# Patient Record
Sex: Female | Born: 2000 | Race: Black or African American | Hispanic: No | Marital: Single | Smoking: Never smoker
Health system: Southern US, Community
[De-identification: ages and names within clinical notes are randomized; demographics above are authoritative.]

## PROBLEM LIST (undated history)

## (undated) DIAGNOSIS — F32A Depression, unspecified: Secondary | ICD-10-CM

## (undated) DIAGNOSIS — T1491XA Suicide attempt, initial encounter: Secondary | ICD-10-CM

## (undated) DIAGNOSIS — F419 Anxiety disorder, unspecified: Secondary | ICD-10-CM

---
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
## 2020-06-24 ENCOUNTER — Emergency Department (HOSPITAL_COMMUNITY)
Admission: EM | Admit: 2020-06-24 | Discharge: 2020-06-25 | Disposition: A | Discharge status: Other Acute Inpatient Hospital | Payer: Self-pay | Attending: Emergency Medicine | Admitting: Emergency Medicine

## 2020-06-24 ENCOUNTER — Encounter (HOSPITAL_COMMUNITY): Payer: Self-pay | Admitting: *Deleted

## 2020-06-24 ENCOUNTER — Other Ambulatory Visit: Payer: Self-pay

## 2020-06-24 DIAGNOSIS — T39392A Poisoning by other nonsteroidal anti-inflammatory drugs [NSAID], intentional self-harm, initial encounter: Secondary | ICD-10-CM | POA: Insufficient documentation

## 2020-06-24 DIAGNOSIS — Z20822 Contact with and (suspected) exposure to covid-19: Secondary | ICD-10-CM | POA: Insufficient documentation

## 2020-06-24 DIAGNOSIS — T50902A Poisoning by unspecified drugs, medicaments and biological substances, intentional self-harm, initial encounter: Secondary | ICD-10-CM

## 2020-06-24 DIAGNOSIS — F333 Major depressive disorder, recurrent, severe with psychotic symptoms: Secondary | ICD-10-CM | POA: Insufficient documentation

## 2020-06-24 HISTORY — DX: Depression, unspecified: F32.A

## 2020-06-24 HISTORY — DX: Anxiety disorder, unspecified: F41.9

## 2020-06-24 HISTORY — DX: Suicide attempt, initial encounter: T14.91XA

## 2020-06-24 LAB — COMPREHENSIVE METABOLIC PANEL
ALT: 20 U/L (ref 0–44)
AST: 23 U/L (ref 15–41)
Albumin: 4 g/dL (ref 3.5–5.0)
Alkaline Phosphatase: 49 U/L (ref 38–126)
Anion gap: 11 (ref 5–15)
BUN: 13 mg/dL (ref 6–20)
CO2: 22 mmol/L (ref 22–32)
Calcium: 9.6 mg/dL (ref 8.9–10.3)
Chloride: 106 mmol/L (ref 98–111)
Creatinine, Ser: 0.92 mg/dL (ref 0.44–1.00)
GFR, Estimated: >60 mL/min (ref >60)
Glucose, Bld: 94 mg/dL (ref 70–99)
Potassium: 4 mmol/L (ref 3.5–5.1)
Sodium: 139 mmol/L (ref 135–145)
Total Bilirubin: 1.2 mg/dL (ref 0.3–1.2)
Total Protein: 7 g/dL (ref 6.5–8.1)

## 2020-06-24 LAB — CBC
HCT: 41.1 % (ref 36.0–46.0)
Hemoglobin: 13.1 g/dL (ref 12.0–15.0)
MCH: 28.4 pg (ref 26.0–34.0)
MCHC: 31.9 g/dL (ref 30.0–36.0)
MCV: 89.2 fL (ref 80.0–100.0)
Platelets: 319 10*3/uL (ref 150–400)
RBC: 4.61 MIL/uL (ref 3.87–5.11)
RDW: 12.3 % (ref 11.5–15.5)
WBC: 5.8 10*3/uL (ref 4.0–10.5)
nRBC: 0 % (ref 0.0–0.2)

## 2020-06-24 LAB — ETHANOL: Alcohol, Ethyl (B): <10 mg/dL (ref <10)

## 2020-06-24 LAB — RAPID URINE DRUG SCREEN, HOSP PERFORMED
Amphetamines: NOT DETECTED
Barbiturates: NOT DETECTED
Benzodiazepines: NOT DETECTED
Cocaine: NOT DETECTED
Opiates: NOT DETECTED
Tetrahydrocannabinol: NOT DETECTED

## 2020-06-24 LAB — I-STAT BETA HCG BLOOD, ED (MC, WL, AP ONLY): I-stat hCG, quantitative: <5 m[IU]/mL (ref <5)

## 2020-06-24 LAB — SALICYLATE LEVEL: Salicylate Lvl: <7 mg/dL — ABNORMAL LOW (ref 7.0–30.0)

## 2020-06-24 LAB — ACETAMINOPHEN LEVEL: Acetaminophen (Tylenol), Serum: <10 ug/mL — ABNORMAL LOW (ref 10–30)

## 2020-06-24 MED ORDER — NORGESTIMATE-ETH ESTRADIOL 0.25-35 MG-MCG PO TABS: 1.0000 | ORAL_TABLET | Freq: Every day | ORAL | Status: DC

## 2020-06-24 NOTE — ED Provider Notes (Signed)
Alexandria Stokes Provider Note   CSN: [id number] Arrival date & time: 06/24/20  1622     History Chief Complaint  Patient presents with  . Ingestion    Alexandria Stokes is a 19 y.o. female.  HPI  Patient presents after intentional ingestion of naprosyn tablets (~15-20) in a suicide attempt.  She notes waxing / waning suicidal ideation for some time, but today she decided to act upon her thoughts.  She has no prior hospitalizations.  She did have n/v following the ingestion, and it is unclear how much of the ingestion was vomited back up. She currently denies physical pain / dyspnea, n/v, or any complaints.  When asked about her current SI, she notes that it has diminished, but remains present.   Past Medical History:  Diagnosis Date  . Anxiety   . Depression   . Suicide attempt (HCC)     There are no problems to display for this patient.   History reviewed. No pertinent surgical history.   OB History   No obstetric history on file.     No family history on file.  Social History   Tobacco Use  . Smoking status: Never Smoker  . Smokeless tobacco: Never Used  Substance Use Topics  . Alcohol use: No  . Drug use: No    Home Medications Prior to Admission medications   Medication Sig Start Date End Date Taking? Authorizing Provider  naproxen (NAPROSYN) 250 MG tablet Take 250 mg by mouth once.   Yes [provider]  SPRINTEC 28 0.25-35 MG-MCG tablet Take 1 tablet by mouth daily. 05/21/20  Yes [provider]    Allergies    Patient has no known allergies.  Review of Systems   Review of Systems  Constitutional:       Per HPI, otherwise negative  HENT:       Per HPI, otherwise negative  Respiratory:       Per HPI, otherwise negative  Cardiovascular:       Per HPI, otherwise negative  Gastrointestinal: Negative for vomiting.  Endocrine:       Negative aside from HPI  Genitourinary:       Neg aside  from HPI   Musculoskeletal:       Per HPI, otherwise negative  Skin: Negative.   Neurological: Negative for syncope.  Psychiatric/Behavioral: Positive for dysphoric mood and suicidal ideas.    Physical Exam Updated Vital Signs BP 115/79 (BP Location: Right Arm)   Pulse (!) 102   Temp 98.5 F (36.9 C) (Oral)   Resp 16   Wt 74.8 kg   SpO2 100%   Physical Exam Vitals and nursing note reviewed.  Constitutional:      General: She is not in acute distress.    Appearance: She is well-developed.  HENT:     Head: Normocephalic and atraumatic.  Eyes:     Conjunctiva/sclera: Conjunctivae normal.  Pulmonary:     Effort: Pulmonary effort is normal. No respiratory distress.     Breath sounds: No stridor.  Abdominal:     General: There is no distension.  Skin:    General: Skin is warm and dry.  Neurological:     Mental Status: She is alert and oriented to person, place, and time.     Cranial Nerves: No cranial nerve deficit.  Psychiatric:        Behavior: Behavior is withdrawn.        Thought Content: Thought content  includes suicidal ideation.        Cognition and Memory: Cognition is not impaired. Memory is not impaired.      ED Results / Procedures / Treatments   Labs (all labs ordered are listed, but only abnormal results are displayed) Labs Reviewed  SALICYLATE LEVEL - Abnormal; Notable for the following components:      Result Value   Salicylate Lvl <7.0 (*)    All other components within normal limits  ACETAMINOPHEN LEVEL - Abnormal; Notable for the following components:   Acetaminophen (Tylenol), Serum <10 (*)    All other components within normal limits  COMPREHENSIVE METABOLIC PANEL  ETHANOL  CBC  RAPID URINE DRUG SCREEN, HOSP PERFORMED  ACETAMINOPHEN LEVEL  I-STAT BETA HCG BLOOD, ED ([facility], [facility], [facility] ONLY)    EKG None  Radiology No results found.  Procedures Procedures (including critical care time)  Medications Ordered in ED Medications - No data to  display  ED Course  I have reviewed the triage vital signs and the nursing notes.  Pertinent labs & imaging results that were available during my care of the patient were reviewed by me and considered in my medical decision making (see chart for details).  Update: Poison control recommends repeat acetaminophen level in 4 hours, continuous monitoring for reassurance that low level of ingestion has not produced toxic effects.  On monitor the patient is sinus rhythm, rate 80s, unremarkable.  Pulse oximetry 100% room air normal   10:23 PM Poison control has been contacted.  Without abnormalities, with reassuring EKG, negative talk screen, case has been closed.  Young adult female presents after intentional ingestion of multiple tablets of Naprosyn.  Patient is awake, alert, has no substantial electrolyte abnormalities, hemodynamic instability, has a reassuring EKG, required hours of monitoring in the ED without substantial decompensation.  Patient appropriate for further evaluation by our behavioral health colleagues.  Patient is not under involuntary commitment, has been cooperative.  She may be appropriate for contracting for safety should she elect to pursue additional psych care as an outpatient. Final Clinical Impression(s) / ED Diagnoses Final diagnoses:  Intentional drug overdose, initial encounter (HCC)   MDM Number of Diagnoses or Management Options Intentional drug overdose, initial encounter [facility]): new, needed workup   Amount and/or Complexity of Data Reviewed Clinical lab tests: reviewed Tests in the medicine section of CPT: reviewed Decide to obtain previous medical records or to obtain history from someone other than the patient: yes Obtain history from someone other than the patient: yes Review and summarize past medical records: yes Discuss the patient with other providers: yes  Risk of Complications, Morbidity, and/or Mortality Presenting problems: high Diagnostic  procedures: high Management options: high  Critical Care Total time providing critical care: < 30 minutes  Patient Progress Patient progress: stable    Gerhard Munch, MD 06/24/20 2226

## 2020-06-24 NOTE — ED Triage Notes (Signed)
Pt hands me a bottle of naproxen and states that she took a handful".  Pt states that she put them all in her mouth and swallowed 15-20 pills and spit the rest out.  Pt did this in suicidal ideation. Pt states that it is a mental thing and that something similar occurred about a month ago.  Pt is calm and cooperative at this time.

## 2020-06-24 NOTE — ED Notes (Signed)
Call to poison control at this time.   Advised them that pt reports that she took 15-20 naproxen 220mg  tablets at 3pm.  Poison control would like EKG and check a 4 hour tylenol level at 7pm since her ingestion was at 3pm.

## 2020-06-24 NOTE — ED Notes (Signed)
Spoke with poison control; says they will close case due to no concerning findings, subject to receiving call from hospital of additional concerns.
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
## 2020-06-25 ENCOUNTER — Encounter (HOSPITAL_COMMUNITY): Payer: Self-pay | Admitting: Nurse Practitioner

## 2020-06-25 ENCOUNTER — Inpatient Hospital Stay (HOSPITAL_COMMUNITY)
Admission: AD | Admit: 2020-06-25 | Discharge: 2020-06-28 | DRG: 885 | Disposition: A | Discharge status: Home / Self Care | Payer: Federal, State, Local not specified - Other | Attending: Psychiatry | Admitting: Psychiatry

## 2020-06-25 DIAGNOSIS — Z23 Encounter for immunization: Secondary | ICD-10-CM

## 2020-06-25 DIAGNOSIS — F332 Major depressive disorder, recurrent severe without psychotic features: Secondary | ICD-10-CM | POA: Diagnosis present

## 2020-06-25 DIAGNOSIS — F411 Generalized anxiety disorder: Secondary | ICD-10-CM

## 2020-06-25 DIAGNOSIS — Z818 Family history of other mental and behavioral disorders: Secondary | ICD-10-CM | POA: Diagnosis not present

## 2020-06-25 DIAGNOSIS — T39312D Poisoning by propionic acid derivatives, intentional self-harm, subsequent encounter: Secondary | ICD-10-CM

## 2020-06-25 DIAGNOSIS — F4310 Post-traumatic stress disorder, unspecified: Secondary | ICD-10-CM

## 2020-06-25 LAB — RESPIRATORY PANEL BY RT PCR (FLU A&B, COVID)
Influenza A by PCR: NEGATIVE
Influenza B by PCR: NEGATIVE
SARS Coronavirus 2 by RT PCR: NEGATIVE

## 2020-06-25 MED ORDER — ALUM & MAG HYDROXIDE-SIMETH 200-200-20 MG/5ML PO SUSP: 30.0000 mL | ORAL | Status: DC | PRN

## 2020-06-25 MED ORDER — TRETINOIN 0.025 % EX CREA: TOPICAL_CREAM | Freq: Every day | CUTANEOUS | Status: DC

## 2020-06-25 MED ORDER — HYDROXYZINE HCL 25 MG PO TABS
25.0000 mg | ORAL_TABLET | Freq: Three times a day (TID) | ORAL | Status: DC | PRN
  Filled 2020-06-25 (×2): qty 1
  Filled 2020-06-25: qty 10

## 2020-06-25 MED ORDER — NORGESTIMATE-ETH ESTRADIOL 0.25-35 MG-MCG PO TABS
ORAL_TABLET | Freq: Every day | ORAL | Status: DC
  Administered 2020-06-25 – 2020-06-28 (×4): 1 via ORAL

## 2020-06-25 MED ORDER — INFLUENZA VAC SPLIT QUAD 0.5 ML IM SUSY
0.5000 mL | PREFILLED_SYRINGE | INTRAMUSCULAR | Status: AC
  Administered 2020-06-27: 0.5 mL via INTRAMUSCULAR
  Filled 2020-06-25: qty 0.5

## 2020-06-25 MED ORDER — SERTRALINE HCL 25 MG PO TABS
25.0000 mg | ORAL_TABLET | Freq: Every day | ORAL | Status: DC
  Administered 2020-06-25 – 2020-06-26 (×2): 25 mg via ORAL
  Filled 2020-06-25 (×4): qty 1

## 2020-06-25 MED ORDER — MAGNESIUM HYDROXIDE 400 MG/5ML PO SUSP: 30.0000 mL | Freq: Every day | ORAL | Status: DC | PRN

## 2020-06-25 MED ORDER — ACETAMINOPHEN 325 MG PO TABS: 650.0000 mg | ORAL_TABLET | Freq: Four times a day (QID) | ORAL | Status: DC | PRN

## 2020-06-25 MED ORDER — TRAZODONE HCL 50 MG PO TABS
50.0000 mg | ORAL_TABLET | Freq: Every evening | ORAL | Status: DC | PRN
  Filled 2020-06-25 (×2): qty 7

## 2020-06-25 NOTE — ED Notes (Signed)
Sitter at end of shift; per Elliot Gurney, Charity fundraiser, Press photographer, no other sitter available and is aware that pt is in hall bed and cannot be consistently watched by existing staff.

## 2020-06-25 NOTE — ED Notes (Signed)
Lunch Tray Ordered @ 1020.  

## 2020-06-25 NOTE — ED Notes (Signed)
Pt at nurses station using phone to talk to mother and update her on POC.

## 2020-06-25 NOTE — ED Provider Notes (Addendum)
9:09 AM patient assessed this morning.  Patient arrived yesterday after an intentional overdose of naproxen.  Labs reviewed and unremarkable.  Patient evaluated by TTS and inpatient placement recommended.  Patient accepted to behavioral health and will be transported after 10 AM today.  BP 107/66   Pulse 86   Temp 98.2 F (36.8 C)   Resp 18   Wt 74.8 kg   SpO2 100%   Patient currently resting comfortably in room.  Vital signs reviewed.   9:40 AM Plan for transfer after 10am. Dr. Jonah Blue will be accepting.    Renne Crigler, PA-C 06/25/20 0909    Renne Crigler, PA-C 06/25/20 0940    Terald Sleeper, MD 06/25/20 [phone]
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
## 2020-06-25 NOTE — Progress Notes (Signed)
Pt is a 19 y/o AAF transferred from [facility] where she presented after an overdose on Naproxen. Pt observed with flat affect, depressed mood and irritability on initial contact. Ambulatory with a steady gait. 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 My mom said she could not afford it now. I am stressed and mad about changing my path. I feel like I should be living out on my own and in the college I wanted to go to. I'm living with my mom. We get into arguments because I'm mad and about what's going on. I got emotional about everything and took a handful of Naproxen. I really don't want to die because I want to study nursing. I called my mom and told her what I did which was wrong and she took me to the ED". 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 Endorsed being anxious, depressed but denies SI, HI, AVH and pain at this time. Reported her anxiety 6/10 and depression 4/10 with main stressor being her relationship with her father "I feel like he's really not there for me but whenever he calls, he just wants to brag about how he's doing". States she works at [employer] 25 hours per week. Per pt "my job can be stressful at times but it's not bothering me right now or stressing me out. It's actually a break from me having all this frustrations and being angry". Per pt her mother is somewhat supportive but "I feel she just don't understand or believe stuff about mental health. She always say God is in control. I just want her to just be there for me". Reports she sleeps 5 hours at night since high school "I always feel pressured throughout the day. That makes me very anxious too" and denies substance use "I don't drink or smoke". Report family history of mental illness "My dad has bipolar and depression. My  mom has anxiety disorder" but denies family history of substance abuse and suicide.  Emotional support and encouragement offered to pt. Skin assessment done and belongings searched per protocol. Pt's skin is intact without areas of breakdown. Items deemed contraband secured in locker.  Unit orientation done, routines discussed and care plan reviewed with pt; understanding verbalized. Q 15 minutes safety checks initiated for safety. Pt receptive to care. Denies concerns at this time. Tolerated meal well when offered. Remains safe on unit.
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
## 2020-06-25 NOTE — ED Notes (Signed)
Mother Mrs. Gibbard would like an update [phone]

## 2020-06-25 NOTE — ED Notes (Signed)
Ordered breakfast--Raji Glinski 
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
## 2020-06-25 NOTE — ED Notes (Signed)
Pt used restroom and returned to hallway bed. This NT gave the pt fresh warm blankets and 1 disposable pillow after taking vitals. Pt laid back down to rest.

## 2020-06-25 NOTE — ED Notes (Signed)
Safe transport called to unit to transfer pt to [facility] per MD order. 1 personal property bag and valuables from security accompany pt. Pt signed voluntary [facility] form, which accompanies pt. Ambulatory off unit. No s/s of acute distress noted.

## 2020-06-25 NOTE — Tx Team (Signed)
Initial Treatment Plan 06/25/2020 8:49 PM Alexandria Stokes [id number]    PATIENT STRESSORS: Educational concerns Marital or family conflict Occupational concerns   PATIENT STRENGTHS: Ability for insight Capable of independent living Communication skills Physical Health Supportive family/friends Work skills   PATIENT IDENTIFIED PROBLEMS: Alterations in mood "I've been depressed and anxious for a while. I just feel pressured".    Risk for self harm "I got into an argument with my mom and took a handful of Naproxen, try to hurt myself but I did not want to die".                 DISCHARGE CRITERIA:  Improved stabilization in mood, thinking, and/or behavior Verbal commitment to aftercare and medication compliance  PRELIMINARY DISCHARGE PLAN: Outpatient therapy Return to previous living arrangement Return to previous work or school arrangements  PATIENT/FAMILY INVOLVEMENT: This treatment plan has been presented to and reviewed with the patient, Alexandria Stokes. The patient have been given the opportunity to ask questions and make suggestions.  Sherryl Manges, RN 06/25/2020, 8:49 PM

## 2020-06-25 NOTE — Progress Notes (Signed)
Patient accepted to 307-1. Patient may be transported after 10:00 a.m. today. Please call report in the a.m. to [phone].

## 2020-06-25 NOTE — Progress Notes (Signed)
Pt approach Clinical research associate at 1320 in a tearful state and signed 72 hour request for discharge "I just want to leave, being here is making my situation worse. I'm going to loose my job and be behind on my classes. I was want to go back to my normal life". Emotional support offered. 72 hour request for discharged was provided and significance explained to patient.  Pt was in agreement and signed document at 1320 this shift.

## 2020-06-25 NOTE — ED Notes (Signed)
Pt moved to 47 for TTS evaluation; sitter with patient.

## 2020-06-26 DIAGNOSIS — F332 Major depressive disorder, recurrent severe without psychotic features: Principal | ICD-10-CM

## 2020-06-26 LAB — LIPID PANEL
Cholesterol: 198 mg/dL (ref 0–200)
HDL: 75 mg/dL (ref >40)
LDL Cholesterol: 112 mg/dL — ABNORMAL HIGH (ref 0–99)
Total CHOL/HDL Ratio: 2.6 RATIO
Triglycerides: 54 mg/dL (ref <150)
VLDL: 11 mg/dL (ref 0–40)

## 2020-06-26 LAB — TSH: TSH: 2.108 u[IU]/mL (ref 0.350–4.500)

## 2020-06-26 MED ORDER — SERTRALINE HCL 50 MG PO TABS
50.0000 mg | ORAL_TABLET | Freq: Every day | ORAL | Status: DC
  Administered 2020-06-27 – 2020-06-28 (×2): 50 mg via ORAL
  Filled 2020-06-26: qty 1
  Filled 2020-06-26 (×2): qty 7
  Filled 2020-06-26: qty 1

## 2020-06-26 NOTE — Progress Notes (Signed)
   06/25/20 2244  Psych Admission Type (Psych Patients Only)  Admission Status Voluntary  Psychosocial Assessment  Patient Complaints None  Eye Contact Fair  Facial Expression Sullen;Worried  Affect Appropriate to circumstance  Systems analyst;Soft  Interaction Cautious;Guarded;Defensive  Motor Activity Slow  Appearance/Hygiene Unremarkable  Behavior Characteristics Cooperative;Calm  Mood Pleasant  Aggressive Behavior  Targets Self  Type of Behavior Other (Comment) (Overdosed on a handful of Naproxen)  Effect No apparent injury  Thought Process  Coherency WDL  Content Blaming self;Blaming others  Delusions None reported or observed  Perception WDL  Hallucination None reported or observed  Judgment Limited  Confusion None  Danger to Self  Current suicidal ideation? Denies  Danger to Others  Danger to Others None reported or observed

## 2020-06-26 NOTE — Progress Notes (Signed)
D:  Patient denied SI and HI, contracts for safety.  Denied A/V hallucinations.  Denied pain. A:  Medications administered per MD orders.  Emotional support and encouragement given patient. R:  Safety maintained with 15 minute checks.  Patient has been sleeping in bed most of the day.

## 2020-06-26 NOTE — Progress Notes (Signed)
[facility] Group Notes:  (Nursing/MHT/Case Management/Adjunct)  Date:  06/26/2020  Time:  2030  Type of Therapy:  wrap up group  Participation Level:  Active  Participation Quality:  Appropriate, Attentive, Sharing and Supportive  Affect:  Depressed  Cognitive:  Alert  Insight:  Improving  Engagement in Group:  Engaged  Modes of Intervention:  Clarification, Education and Support  Summary of Progress/Problems: Positive thinking and self care were discussed.   Marcille Buffy 06/26/2020, 9:21 PM

## 2020-06-26 NOTE — Progress Notes (Signed)
   06/26/20 2341  COVID-19 Daily Checkoff  Have you had a fever (temp > 37.80C/100F)  in the past 24 hours?  No  If you have had runny nose, nasal congestion, sneezing in the past 24 hours, has it worsened? No  COVID-19 EXPOSURE  Have you traveled outside the state in the past 14 days? No  Have you been in contact with someone with a confirmed diagnosis of COVID-19 or PUI in the past 14 days without wearing appropriate PPE? No  Have you been living in the same home as a person with confirmed diagnosis of COVID-19 or a PUI (household contact)? No  Have you been diagnosed with COVID-19? No

## 2020-06-26 NOTE — Plan of Care (Signed)
Nurse discussed anxiety, depression and coping skills with patient.  

## 2020-06-26 NOTE — [facility] Group Notes (Signed)
LCSW Group Therapy Note  06/26/2020   10:00-11:00am   Type of Therapy and Topic:  Group Therapy: Anger Cues and Responses  Participation Level:  Active   Description of Group:   In this group, patients learned how to recognize the physical, cognitive, emotional, and behavioral responses they have to anger-provoking situations.  They identified a recent time they became angry and how they reacted.  They analyzed how their reaction was possibly beneficial and how it was possibly unhelpful.  The group discussed a variety of healthier coping skills that could help with such a situation in the future.  Focus was placed on how helpful it is to recognize the underlying emotions to our anger, because working on those can lead to a more permanent solution as well as our ability to focus on the important rather than the urgent.  Therapeutic Goals: 1. Patients will remember their last incident of anger and how they felt emotionally and physically, what their thoughts were at the time, and how they behaved. 2. Patients will identify how their behavior at that time worked for them, as well as how it worked against them. 3. Patients will explore possible new behaviors to use in future anger situations. 4. Patients will learn that anger itself is normal and cannot be eliminated, and that healthier reactions can assist with resolving conflict rather than worsening situations.  Summary of Patient Progress:  The patient shared that her most recent time of anger was the day she was admitted to the hospital and she rated it a 10 out of 10.  She said her anger was caused by family pressure that she felt was bullying about her academics.  She stated she is the first person in her family to go to college and her parents pressure her a great deal.  She has anxiety and is not on medicine, had stopped therapy.  Her method of coping with her anger was to grab pills (Naproxen) and take them to "make it stop."  She was  insightful about how this coping mechanism was unhealthy under any circumstance, and was able to identify other methods which could be tried for a better outcome.  Therapeutic Modalities:   Cognitive Behavioral Therapy  Lynnell Chad

## 2020-06-26 NOTE — Progress Notes (Signed)
   06/26/20 2344  Psych Admission Type (Psych Patients Only)  Admission Status Voluntary  Psychosocial Assessment  Patient Complaints None  Eye Contact  (appropriate)  Facial Expression Other (Comment) (Pleasant)  Affect Appropriate to circumstance  Speech Logical/coherent  Interaction Minimal  Motor Activity Other (Comment) (WDL)  Appearance/Hygiene Unremarkable  Behavior Characteristics Appropriate to situation  Mood Pleasant  Thought Process  Coherency WDL  Content WDL  Delusions None reported or observed  Perception WDL  Hallucination None reported or observed  Judgment Limited  Confusion None  Danger to Self  Current suicidal ideation? Denies  Danger to Others  Danger to Others None reported or observed

## 2020-06-26 NOTE — Progress Notes (Signed)
[facility] MD Progress Note  06/26/2020 11:08 AM Alexandria Stokes  MRN:  [id number]   Subjective:   Alexandria Stokes  Stated " I feel back to my normal self"  Evaluation:  [name] was observed attending daily group session.  She is awake alert and oriented x3.  Denying suicidal or homicidal ideations.  Denies auditory or visual hallucinations.  Patient reports regret about taking a handful of medications.  She reports multiple family stressors coupled with school anxiety caused this "knee jerk" reaction.  States she last attended family therapy when she was about  19 years old and has plans to find a therapist at discharge.  She reports struggling with depression and anxiety for a while however has never sought help.  Patient is rating her depression 5 out of 10 with 10 being the worst.  Patient was initiated on Zoloft 25 mg on admission.  Discussed titrating Zoloft to 50 mg daily.  Patient was receptive to plan.  Reports a good appetite.  States she is resting well throughout the night.  Staff to continue to monitor for safety.  Support, encouragement and reassurance was provided.  Principal Problem: Severe recurrent major depression without psychotic features (HCC) Diagnosis: Principal Problem:   Severe recurrent major depression without psychotic features (HCC) Active Problems:   PTSD (post-traumatic stress disorder)   GAD (generalized anxiety disorder)  Total Time spent with patient: 15 minutes  Past Psychiatric History:   Past Medical History:  Past Medical History:  Diagnosis Date  . Anxiety   . Depression   . Suicide attempt [facility])    History reviewed. No pertinent surgical history. Family History: History reviewed. No pertinent family history. Family Psychiatric  History:  Social History:  Social History   Substance and Sexual Activity  Alcohol Use No     Social History   Substance and Sexual Activity  Drug Use No    Social History   Socioeconomic History  . Marital status: Single     Spouse name: Not on file  . Number of children: Not on file  . Years of education: Not on file  . Highest education level: Not on file  Occupational History  . Not on file  Tobacco Use  . Smoking status: Never Smoker  . Smokeless tobacco: Never Used  Substance and Sexual Activity  . Alcohol use: No  . Drug use: No  . Sexual activity: Not on file  Other Topics Concern  . Not on file  Social History Narrative  . Not on file   Social Determinants of Health   Financial Resource Strain:   . Difficulty of Paying Living Expenses: Not on file  Food Insecurity:   . Worried About Programme researcher, broadcasting/film/video in the Last Year: Not on file  . Ran Out of Food in the Last Year: Not on file  Transportation Needs:   . Lack of Transportation (Medical): Not on file  . Lack of Transportation (Non-Medical): Not on file  Physical Activity:   . Days of Exercise per Week: Not on file  . Minutes of Exercise per Session: Not on file  Stress:   . Feeling of Stress : Not on file  Social Connections:   . Frequency of Communication with Friends and Family: Not on file  . Frequency of Social Gatherings with Friends and Family: Not on file  . Attends Religious Services: Not on file  . Active Member of Clubs or Organizations: Not on file  . Attends Banker Meetings: Not  on file  . Marital Status: Not on file   Additional Social History:                         Sleep: Good  Appetite:  Good  Current Medications: Current Facility-Administered Medications  Medication Dose Route Frequency Provider Last Rate Last Admin  . acetaminophen (TYLENOL) tablet 650 mg  650 mg Oral Q6H PRN Jackelyn Poling, NP      . alum & mag hydroxide-simeth (MAALOX/MYLANTA) 200-200-20 MG/5ML suspension 30 mL  30 mL Oral Q4H PRN Nira Conn A, NP      . hydrOXYzine (ATARAX/VISTARIL) tablet 25 mg  25 mg Oral TID PRN Nira Conn A, NP      . influenza vac split quadrivalent PF (FLUARIX) injection 0.5 mL  0.5 mL  Intramuscular Tomorrow-1000 Estella Husk, MD      . magnesium hydroxide (MILK OF MAGNESIA) suspension 30 mL  30 mL Oral Daily PRN Jackelyn Poling, NP      . norgestimate-ethinyl estradiol (ORTHO-CYCLEN) 0.25-35 MG-MCG tablet   Oral Daily Estella Husk, MD   1 tablet at 06/26/20 0900  . [START ON 06/27/2020] sertraline (ZOLOFT) tablet 50 mg  50 mg Oral Daily Oneta Rack, NP      . traZODone (DESYREL) tablet 50 mg  50 mg Oral QHS PRN Jackelyn Poling, NP      . tretinoin (RETIN-A) 0.025 % cream   Topical Daily Estella Husk, MD   Given at 06/26/20 0900    Lab Results:  Results for orders placed or performed during the hospital encounter of 06/25/20 (from the past 48 hour(s))  Lipid panel     Status: Abnormal   Collection Time: 06/26/20  6:40 AM  Result Value Ref Range   Cholesterol 198 0 - 200 mg/dL   Triglycerides 54 <259 mg/dL   HDL 75 >56 mg/dL   Total CHOL/HDL Ratio 2.6 RATIO   VLDL 11 0 - 40 mg/dL   LDL Cholesterol 387 (H) 0 - 99 mg/dL    Comment:        Total Cholesterol/HDL:CHD Risk Coronary Heart Disease Risk Table                     Men   Women  1/2 Average Risk   3.4   3.3  Average Risk       5.0   4.4  2 X Average Risk   9.6   7.1  3 X Average Risk  23.4   11.0        Use the calculated Patient Ratio above and the CHD Risk Table to determine the patient's CHD Risk.        ATP III CLASSIFICATION (LDL):  <100     mg/dL   Optimal  564-332  mg/dL   Near or Above                    Optimal  130-159  mg/dL   Borderline  951-884  mg/dL   High  >166     mg/dL   Very High Performed at [facility], [address] [place], [place] [address]   TSH     Status: None   Collection Time: 06/26/20  6:40 AM  Result Value Ref Range   TSH 2.108 0.350 - 4.500 uIU/mL    Comment: Performed by a 3rd Generation assay with a functional sensitivity of <=0.01  uIU/mL. Performed at [facility], [address] [place],  [place] [address]     Blood Alcohol level:  Lab Results  Component Value Date   ETH <10 06/24/2020    Metabolic Disorder Labs: No results found for: HGBA1C, MPG No results found for: PROLACTIN Lab Results  Component Value Date   CHOL 198 06/26/2020   TRIG 54 06/26/2020   HDL 75 06/26/2020   CHOLHDL 2.6 06/26/2020   VLDL 11 06/26/2020   LDLCALC 112 (H) 06/26/2020    Physical Findings: AIMS:  , ,  ,  ,    CIWA:    COWS:     Musculoskeletal: Strength & Muscle Tone: within normal limits Gait & Station: normal Patient leans: N/A  Psychiatric Specialty Exam: Physical Exam Vitals reviewed.  Psychiatric:        Attention and Perception: Attention normal.        Mood and Affect: Mood normal.        Behavior: Behavior normal.        Thought Content: Thought content normal.     Review of Systems  Psychiatric/Behavioral: Negative for hallucinations and suicidal ideas. The patient is nervous/anxious.   All other systems reviewed and are negative.   Blood pressure 118/86, pulse (!) 106, temperature 97.6 F (36.4 C), resp. rate 18, height 5\' 2"  (1.575 m), weight 75.8 kg, SpO2 100 %.Body mass index is 30.54 kg/m.  General Appearance: Casual  Eye Contact:  Good  Speech:  Clear and Coherent  Volume:  Normal  Mood:  Anxious and Depressed  Affect:  Appropriate  Thought Process:  Coherent  Orientation:  Full (Time, Place, and Person)  Thought Content:  Hallucinations: None  Suicidal Thoughts:  No  Homicidal Thoughts:  No  Memory:  Immediate;   Fair Remote;   Fair  Judgement:  Fair  Insight:  Fair  Psychomotor Activity:  Normal  Concentration:  Concentration: Fair  Recall:  of Knowledge:  Fair  Language:  Fair  Akathisia:  No  Handed:  Right  AIMS (if indicated):     Assets:  Communication Skills Desire for Improvement Resilience Social Support  ADL's:  Intact  Cognition:  WNL  Sleep:  Number of Hours: 7     Treatment Plan Summary: Daily contact with  patient to assess and evaluate symptoms and progress in treatment and Medication management   Treatment plan was reviewed and agreed upon on 06/26/2020 by NP T.06/28/2020 and patient Chicquita Mendel need for continued inpatient admission  -Increased Zoloft 25 mg to 50 mg p.o. daily -Continue trazodone 50 mg p.o. as needed nightly  CSW to continue working on discharge disposition -Consider partial hospitalization programming at discharge Patient encouraged to participate within the therapeutic milieu  Chauncey Cruel, NP 06/26/2020, 11:08 AM

## 2020-06-27 NOTE — Progress Notes (Signed)
[facility] Group Notes:  (Nursing/MHT/Case Management/Adjunct)  Date:  06/27/2020  Time:  2030  Type of Therapy:  wrap up group  Participation Level:  Active  Participation Quality:  Appropriate, Attentive, Sharing and Supportive  Affect:  Depressed  Cognitive:  Appropriate  Insight:  Improving  Engagement in Group:  Engaged  Modes of Intervention:  Clarification, Education and Support  Summary of Progress/Problems: Positive thinking and positive change were discussed.   Marcille Buffy 06/27/2020, 8:55 PM

## 2020-06-27 NOTE — [facility] Group Notes (Signed)
[facility] LCSW Group Therapy Note  Date/Time:  06/27/2020 9:00-10:00 or 10:00-11:00AM  Type of Therapy and Topic:  Group Therapy:  Healthy and Unhealthy Supports  Participation Level:  Active   Description of Group:  Patients in this group were introduced to the idea of adding a variety of healthy supports to address the various needs in their lives.Patients discussed what additional healthy supports could be helpful in their recovery and wellness after discharge in order to prevent future hospitalizations.   An emphasis was placed on using counselor, doctor, therapy groups, 12-step groups, and problem-specific support groups to expand supports.  Several songs were played to emphasize points made throughout group.  Therapeutic Goals:   1)  discuss importance of adding supports to stay well once out of the hospital  2)  compare healthy versus unhealthy supports and identify some examples of each  3)  generate ideas and descriptions of healthy supports that can be added  4)  offer mutual support about how to address unhealthy supports  5)  encourage active participation in and adherence to discharge plan    Summary of Patient Progress:  The patient stated that current healthy supports in her life are her cousin, her classmates in nursing school, her co-worker, and her best friend, while current unhealthy supports include her mother and father as well as some professors who just don't care about students.  The patient expressed a willingness to add a conversation about boundaries with her parents with the use of her cousin as support(s) to help in her recovery journey.   Therapeutic Modalities:   Motivational Interviewing Brief Solution-Focused Therapy  Ambrose Mantle, LCSW

## 2020-06-27 NOTE — Plan of Care (Signed)
Nurse discussed anxiety, depression and coping skills with patient.  

## 2020-06-27 NOTE — Progress Notes (Signed)
D:  Patient's self inventory sheet, patient has fair sleep, no sleep medication.  Fair appetite, normal energy level, good concentration.  Denied depression 2, hopeless 2, anxiety 2.   Denied withdrawals.  Denied SI.  Denied physical problems.  Denied physical pain.  Goal is stay patient, positive, active.  Plans to stay out of her room and socialize.  Does have discharge plans. A:  Medications administered per MD orders.  Emotional support and encouragement given patient. R:  Denied SI and HI, contracts for safety.  Denied A/V hallucinations.  Safety maintained with 15 minute checks.

## 2020-06-28 LAB — HEMOGLOBIN A1C
Hgb A1c MFr Bld: 4.9 % (ref 4.8–5.6)
Mean Plasma Glucose: 94 mg/dL

## 2020-06-28 MED ORDER — SERTRALINE HCL 50 MG PO TABS: 50.0000 mg | ORAL_TABLET | Freq: Every day | ORAL | 0 refills | Status: DC

## 2020-06-28 MED ORDER — HYDROXYZINE HCL 25 MG PO TABS: 25.0000 mg | ORAL_TABLET | Freq: Three times a day (TID) | ORAL | 0 refills | Status: DC | PRN

## 2020-06-28 NOTE — Progress Notes (Signed)
   06/28/20 0002  Psych Admission Type (Psych Patients Only)  Admission Status Voluntary  Psychosocial Assessment  Patient Complaints None  Eye Contact Brief  Facial Expression Other (Comment) (appropriate)  Affect Appropriate to circumstance  Speech Logical/coherent  Interaction Minimal  Motor Activity Other (Comment) (WDL)  Appearance/Hygiene Unremarkable  Behavior Characteristics Appropriate to situation  Mood Pleasant  Thought Process  Coherency WDL  Content WDL  Delusions None reported or observed  Perception WDL  Hallucination None reported or observed  Judgment WDL  Confusion None  Danger to Self  Current suicidal ideation? Denies  Danger to Others  Danger to Others None reported or observed

## 2020-06-28 NOTE — Progress Notes (Signed)
Adult Psychoeducational Group Note  Date:  06/28/2020 Time:  10:19 AM  Group Topic/Focus:  Wrap-Up Group:   The focus of this group is to help patients review their daily goal of treatment and discuss progress on daily workbooks.  Participation Level:  Active  Participation Quality:  Appropriate  Affect:  Appropriate  Cognitive:  Appropriate  Insight: Appropriate  Engagement in Group:  Engaged  Modes of Intervention:  Discussion  Additional Comments:  Patient attended goals group and said that her goal for today is to be discharged.   Steel Kerney W Maylene Crocker 06/28/2020, 10:19 AM

## 2020-06-28 NOTE — [facility] Suicide Risk Assessment (Signed)
[facility] INPATIENT:  Family/Significant Other Suicide Prevention Education  Suicide Prevention Education:  Education Completed; Shamel Galyean [phone] (Mother) has been identified by the patient as the family member/significant other with whom the patient will be residing, and identified as the person(s) who will aid the patient in the event of a mental health crisis (suicidal ideations/suicide attempt).  With written consent from the patient, the family member/significant other has been provided the following suicide prevention education, prior to the and/or following the discharge of the patient.  The suicide prevention education provided includes the following:  Suicide risk factors  Suicide prevention and interventions  National Suicide Hotline telephone number  [facility] assessment telephone number  [facility] Emergency Assistance 911  [facility] and/or Residential Mobile Crisis Unit telephone number  Request made of family/significant other to:  Remove weapons (e.g., guns, rifles, knives), all items previously/currently identified as safety concern.    Remove drugs/medications (over-the-counter, prescriptions, illicit drugs), all items previously/currently identified as a safety concern.  The family member/significant other verbalizes understanding of the suicide prevention education information provided.  The family member/significant other agrees to remove the items of safety concern listed above.  Mrs. Penaloza states that her daughter is coming home to live with her and that she has no firearms in the home and has taken precautions to put away all knifes and weapons in the home for her daughter's protection.  Mrs. Kleiman states that she has no concerns for her daughter coming home and just wants to make sure she has follow up appointments for therapy and medication management when she leaves the hospital.      Aram Beecham 06/28/2020, 9:22 AM

## 2020-06-28 NOTE — Progress Notes (Signed)
Discharge Note:  Patient discharged with mother to mother's  home with husband.  Patient denied SI and HI.  Denied A/V hallucinations.  Suicide prevention information given and discussed with patient who stated she understood and had no questions.  Patient stated she received all her belongings, clothing, toiletries, misc items, etc.  Patient stated she appreciated all assistance received from [facility] staff.  All required discharge information given to patient.

## 2020-06-28 NOTE — [facility] Suicide Risk Assessment (Signed)
[facility] Discharge Suicide Risk Assessment   Principal Problem: Severe recurrent major depression without psychotic features [facility]) Discharge Diagnoses: Principal Problem:   Severe recurrent major depression without psychotic features (HCC) Active Problems:   PTSD (post-traumatic stress disorder)   GAD (generalized anxiety disorder)   Total Time spent with patient: 20 minutes  Musculoskeletal: Strength & Muscle Tone: within normal limits Gait & Station: normal Patient leans: N/A  Psychiatric Specialty Exam: Review of Systems  All other systems reviewed and are negative.   Blood pressure 117/88, pulse 96, temperature 98.6 F (37 C), temperature source Oral, resp. rate 16, height 5\' 2"  (1.575 m), weight 75.8 kg, SpO2 100 %.Body mass index is 30.54 kg/m.  General Appearance: Casual  Eye Contact::  Good  Speech:  Normal Rate409  Volume:  Normal  Mood:  Euthymic  Affect:  Congruent  Thought Process:  Coherent and Descriptions of Associations: Intact  Orientation:  Full (Time, Place, and Person)  Thought Content:  Logical  Suicidal Thoughts:  No  Homicidal Thoughts:  No  Memory:  Immediate;   Good Recent;   Good Remote;   Good  Judgement:  Intact  Insight:  Fair  Psychomotor Activity:  Normal  Concentration:  Good  Recall:  Good  Fund of Knowledge:Good  Language: Good  Akathisia:  Negative  Handed:  Right  AIMS (if indicated):     Assets:  Communication Skills Desire for Improvement Housing Resilience Social Support Talents/Skills  Sleep:  Number of Hours: 6  Cognition: WNL  ADL's:  Intact   Mental Status Per Nursing Assessment::   On Admission:  Self-harm behaviors (Prior to admission)  Demographic Factors:  Adolescent or young adult  Loss Factors: NA  Historical Factors: Impulsivity  Risk Reduction Factors:   Sense of responsibility to family, Living with another person, especially a relative and Positive social support  Continued Clinical Symptoms:   Depression:   Impulsivity  Cognitive Features That Contribute To Risk:  None    Suicide Risk:  Minimal: No identifiable suicidal ideation.  Patients presenting with no risk factors but with morbid ruminations; may be classified as minimal risk based on the severity of the depressive symptoms   Follow-up Information    [facility]. Go to.   Specialty: Behavioral Health Why: A referral has been made to this provider for  therapy and medication management services.  Contact information: [address] [place] [phone]              Plan Of Care/Follow-up recommendations:  Activity:  ad lib  [phone], MD 06/28/2020, 9:19 AM

## 2020-06-28 NOTE — Progress Notes (Addendum)
D:  Patient's self inventory sheet, patient has fair sleep, no sleep medication.  Good appetite, normal energy level, good concentration.  Rated depression 2, hopeless 2, anxiety 2.  Denied withdrawals.  Denied SI.  Denied physical problems.  Denied physical pain.  Goal is discharge.  Make sure everything is set and be patient.  Does have discharge plans. A:  Medications administered per MD orders.  Emotional support and encouragement given patient. R:  Denied SI and HI, contracts for safety.  Denied A/V hallucinations.  Safety maintained with 15 minute checks.

## 2020-06-28 NOTE — Tx Team (Signed)
Interdisciplinary Treatment and Diagnostic Plan Update  06/28/2020 Time of Session: 9:20am Alexandria Stokes MRN: [id number]  Principal Diagnosis: Severe recurrent major depression without psychotic features [facility])  Secondary Diagnoses: Principal Problem:   Severe recurrent major depression without psychotic features ([place]) Active Problems:   PTSD (post-traumatic stress disorder)   GAD (generalized anxiety disorder)   Current Medications:  Current Facility-Administered Medications  Medication Dose Route Frequency Provider Last Rate Last Admin  . acetaminophen (TYLENOL) tablet 650 mg  650 mg Oral Q6H PRN Rozetta Nunnery, NP      . alum & mag hydroxide-simeth (MAALOX/MYLANTA) 200-200-20 MG/5ML suspension 30 mL  30 mL Oral Q4H PRN Lindon Romp A, NP      . hydrOXYzine (ATARAX/VISTARIL) tablet 25 mg  25 mg Oral TID PRN Lindon Romp A, NP      . magnesium hydroxide (MILK OF MAGNESIA) suspension 30 mL  30 mL Oral Daily PRN Rozetta Nunnery, NP      . norgestimate-ethinyl estradiol (ORTHO-CYCLEN) 0.25-35 MG-MCG tablet   Oral Daily Ival Bible, MD   1 tablet at 06/28/20 0800  . sertraline (ZOLOFT) tablet 50 mg  50 mg Oral Daily Derrill Center, NP   50 mg at 06/28/20 0800  . traZODone (DESYREL) tablet 50 mg  50 mg Oral QHS PRN Lindon Romp A, NP      . tretinoin (RETIN-A) 0.025 % cream   Topical Daily Ival Bible, MD   Given at 06/27/20 0827   PTA Medications: Medications Prior to Admission  Medication Sig Dispense Refill Last Dose  . naproxen (NAPROSYN) 250 MG tablet Take 250 mg by mouth once.     . SPRINTEC 28 0.25-35 MG-MCG tablet Take 1 tablet by mouth daily.       Patient Stressors: Educational concerns Marital or family conflict Occupational concerns  Patient Strengths: Ability for insight Capable of independent living Communication skills Physical Health Supportive family/friends Work skills  Treatment Modalities: Medication Management, Group therapy, Case  management,  1 to 1 session with clinician, Psychoeducation, Recreational therapy.   Physician Treatment Plan for Primary Diagnosis: Severe recurrent major depression without psychotic features ([place]) Long Term Goal(s): Improvement in symptoms so as ready for discharge Improvement in symptoms so as ready for discharge   Short Term Goals: Ability to identify changes in lifestyle to reduce recurrence of condition will improve Ability to verbalize feelings will improve Ability to disclose and discuss suicidal ideas Ability to demonstrate self-control will improve Ability to identify and develop effective coping behaviors will improve Ability to identify changes in lifestyle to reduce recurrence of condition will improve Ability to verbalize feelings will improve Ability to disclose and discuss suicidal ideas Ability to demonstrate self-control will improve Ability to identify and develop effective coping behaviors will improve  Medication Management: Evaluate patient's response, side effects, and tolerance of medication regimen.  Therapeutic Interventions: 1 to 1 sessions, Unit Group sessions and Medication administration.  Evaluation of Outcomes: Met  Physician Treatment Plan for Secondary Diagnosis: Principal Problem:   Severe recurrent major depression without psychotic features ([place]) Active Problems:   PTSD (post-traumatic stress disorder)   GAD (generalized anxiety disorder)  Long Term Goal(s): Improvement in symptoms so as ready for discharge Improvement in symptoms so as ready for discharge   Short Term Goals: Ability to identify changes in lifestyle to reduce recurrence of condition will improve Ability to verbalize feelings will improve Ability to disclose and discuss suicidal ideas Ability to demonstrate self-control will improve Ability to  identify and develop effective coping behaviors will improve Ability to identify changes in lifestyle to reduce recurrence of  condition will improve Ability to verbalize feelings will improve Ability to disclose and discuss suicidal ideas Ability to demonstrate self-control will improve Ability to identify and develop effective coping behaviors will improve     Medication Management: Evaluate patient's response, side effects, and tolerance of medication regimen.  Therapeutic Interventions: 1 to 1 sessions, Unit Group sessions and Medication administration.  Evaluation of Outcomes: Met   RN Treatment Plan for Primary Diagnosis: Severe recurrent major depression without psychotic features ([place]) Long Term Goal(s): Knowledge of disease and therapeutic regimen to maintain health will improve  Short Term Goals: Ability to participate in decision making will improve, Ability to disclose and discuss suicidal ideas and Ability to identify and develop effective coping behaviors will improve  Medication Management: RN will administer medications as ordered by provider, will assess and evaluate patient's response and provide education to patient for prescribed medication. RN will report any adverse and/or side effects to prescribing provider.  Therapeutic Interventions: 1 on 1 counseling sessions, Psychoeducation, Medication administration, Evaluate responses to treatment, Monitor vital signs and CBGs as ordered, Perform/monitor CIWA, COWS, AIMS and Fall Risk screenings as ordered, Perform wound care treatments as ordered.  Evaluation of Outcomes: Progressing   LCSW Treatment Plan for Primary Diagnosis: Severe recurrent major depression without psychotic features ([place]) Long Term Goal(s): Safe transition to appropriate next level of care at discharge, Engage patient in therapeutic group addressing interpersonal concerns.  Short Term Goals: Engage patient in aftercare planning with referrals and resources, Increase social support, Facilitate patient progression through stages of change regarding substance use diagnoses and  concerns and Identify triggers associated with mental health/substance abuse issues  Therapeutic Interventions: Assess for all discharge needs, 1 to 1 time with Social worker, Explore available resources and support systems, Assess for adequacy in community support network, Educate family and significant other(s) on suicide prevention, Complete Psychosocial Assessment, Interpersonal group therapy.  Evaluation of Outcomes: Met   Progress in Treatment: Attending groups: Yes. Participating in groups: Yes. Taking medication as prescribed: Yes. Toleration medication: Yes. Family/Significant other contact made: Yes, individual(s) contacted:  mom Margarethe Virgen (mother) [phone] Patient understands diagnosis: Yes. Discussing patient identified problems/goals with staff: Yes. Medical problems stabilized or resolved: Yes. Denies suicidal/homicidal ideation: Yes. Issues/concerns per patient self-inventory: Yes. Other:   New problem(s) identified: No, Describe:  pt is appropriate for d/c  New Short Term/Long Term Goal(s):medication stabilization, elimination of SI thoughts, development of comprehensive mental wellness plan.  Patient Goals:  "to feel more positive."  Discharge Plan or Barriers: appts for medication management and therapy at [facility]  Reason for Continuation of Hospitalization: Depression Medication stabilization Suicidal ideation  Estimated Length of Stay: 3-5 days  Attendees: Patient:Alexandria Stokes [facility] 06/28/2020 9:46 AM  Physician: Dr. Claris Gower 06/28/2020 9:46 AM  Nursing:  06/28/2020 9:46 AM  RN Care Manager: 06/28/2020 9:46 AM  Social Worker: Toney Reil, [place] 06/28/2020 9:46 AM  Recreational Therapist:  06/28/2020 9:46 AM  Other:  06/28/2020 9:46 AM  Other:  06/28/2020 9:46 AM  Other: 06/28/2020 9:46 AM    Scribe for Treatment Team: Mliss Fritz, [place] 06/28/2020 9:46 AM

## 2020-06-28 NOTE — Progress Notes (Signed)
  [facility] Adult Case Management Discharge Plan :  Will you be returning to the same living situation after discharge:  Yes,  Home with mother  At discharge, do you have transportation home?: Yes,  Mother  Do you have the ability to pay for your medications: Yes,  Family   Release of information consent forms completed and in the chart;  Patient's signature needed at discharge.  Patient to Follow up at:  Follow-up Information    [facility]. Go to.   Specialty: Behavioral Health Why: Therapy appointment is a walk-in and is scheduled for 07/09/20 at 12:30pm.  Medication management is scheduled for 07/21/20 at 9:00am.   Contact information: [address] [place] [address] [phone]              Next level of care provider has access to [facility] Link:yes  Safety Planning and Suicide Prevention discussed: Yes,  Mother and patient      Has patient been referred to the Quitline?: Patient refused referral  Patient has been referred for addiction treatment: N/A  Aram Beecham, LCSWA 06/28/2020, 9:58 AM

## 2020-06-28 NOTE — Plan of Care (Signed)
Nurse discussed anxiety, depression and coping skills with patient.  
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
## 2020-06-28 NOTE — Progress Notes (Signed)
   06/28/20 0000  COVID-19 Daily Checkoff  Have you had a fever (temp > 37.80C/100F)  in the past 24 hours?  No  If you have had runny nose, nasal congestion, sneezing in the past 24 hours, has it worsened? No  COVID-19 EXPOSURE  Have you traveled outside the state in the past 14 days? No  Have you been in contact with someone with a confirmed diagnosis of COVID-19 or PUI in the past 14 days without wearing appropriate PPE? No  Have you been living in the same home as a person with confirmed diagnosis of COVID-19 or a PUI (household contact)? No  Have you been diagnosed with COVID-19? No

## 2020-07-09 ENCOUNTER — Ambulatory Visit (INDEPENDENT_AMBULATORY_CARE_PROVIDER_SITE_OTHER): Payer: No Payment, Other | Admitting: Behavioral Health

## 2020-07-09 ENCOUNTER — Other Ambulatory Visit: Payer: Self-pay

## 2020-07-09 DIAGNOSIS — F332 Major depressive disorder, recurrent severe without psychotic features: Secondary | ICD-10-CM

## 2020-07-09 DIAGNOSIS — F411 Generalized anxiety disorder: Secondary | ICD-10-CM | POA: Diagnosis not present
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
## 2020-07-21 ENCOUNTER — Other Ambulatory Visit: Payer: Self-pay

## 2020-07-21 ENCOUNTER — Encounter (HOSPITAL_COMMUNITY): Payer: Self-pay | Admitting: Physician Assistant

## 2020-07-21 ENCOUNTER — Ambulatory Visit (INDEPENDENT_AMBULATORY_CARE_PROVIDER_SITE_OTHER): Payer: No Payment, Other | Admitting: Physician Assistant

## 2020-07-21 VITALS — BP 111/81 | HR 86 | Ht 62.0 in | Wt 167.0 lb

## 2020-07-21 DIAGNOSIS — F411 Generalized anxiety disorder: Secondary | ICD-10-CM

## 2020-07-21 DIAGNOSIS — F332 Major depressive disorder, recurrent severe without psychotic features: Secondary | ICD-10-CM | POA: Diagnosis not present

## 2020-07-21 MED ORDER — HYDROXYZINE HCL 25 MG PO TABS: 25.0000 mg | ORAL_TABLET | Freq: Three times a day (TID) | ORAL | 0 refills | Status: DC | PRN

## 2020-07-21 MED ORDER — SERTRALINE HCL 50 MG PO TABS: 50.0000 mg | ORAL_TABLET | Freq: Every day | ORAL | 0 refills | Status: DC
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
## 2020-08-04 ENCOUNTER — Other Ambulatory Visit (HOSPITAL_COMMUNITY): Payer: Self-pay | Admitting: Psychiatry

## 2020-08-04 DIAGNOSIS — F411 Generalized anxiety disorder: Secondary | ICD-10-CM

## 2020-08-04 DIAGNOSIS — F332 Major depressive disorder, recurrent severe without psychotic features: Secondary | ICD-10-CM

## 2020-08-06 ENCOUNTER — Other Ambulatory Visit (HOSPITAL_COMMUNITY): Payer: Self-pay | Admitting: Psychiatry

## 2020-08-06 DIAGNOSIS — F332 Major depressive disorder, recurrent severe without psychotic features: Secondary | ICD-10-CM

## 2020-08-06 DIAGNOSIS — F411 Generalized anxiety disorder: Secondary | ICD-10-CM

## 2020-08-09 ENCOUNTER — Other Ambulatory Visit (HOSPITAL_COMMUNITY): Payer: Self-pay | Admitting: Psychiatry

## 2020-08-09 DIAGNOSIS — F411 Generalized anxiety disorder: Secondary | ICD-10-CM

## 2020-08-09 DIAGNOSIS — F332 Major depressive disorder, recurrent severe without psychotic features: Secondary | ICD-10-CM

## 2020-08-24 ENCOUNTER — Other Ambulatory Visit: Payer: Self-pay

## 2020-08-24 ENCOUNTER — Ambulatory Visit (INDEPENDENT_AMBULATORY_CARE_PROVIDER_SITE_OTHER): Payer: No Payment, Other | Admitting: Clinical

## 2020-08-24 DIAGNOSIS — F411 Generalized anxiety disorder: Secondary | ICD-10-CM | POA: Diagnosis not present
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
## 2020-09-01 ENCOUNTER — Telehealth (INDEPENDENT_AMBULATORY_CARE_PROVIDER_SITE_OTHER): Payer: No Payment, Other | Admitting: Physician Assistant

## 2020-09-01 ENCOUNTER — Other Ambulatory Visit: Payer: Self-pay

## 2020-09-01 DIAGNOSIS — F411 Generalized anxiety disorder: Secondary | ICD-10-CM

## 2020-09-01 DIAGNOSIS — F332 Major depressive disorder, recurrent severe without psychotic features: Secondary | ICD-10-CM

## 2020-09-01 MED ORDER — BUSPIRONE HCL 5 MG PO TABS: 5.0000 mg | ORAL_TABLET | Freq: Two times a day (BID) | ORAL | 1 refills | Status: DC

## 2020-09-01 MED ORDER — SERTRALINE HCL 50 MG PO TABS: 50.0000 mg | ORAL_TABLET | Freq: Every day | ORAL | 0 refills | Status: DC

## 2020-09-01 NOTE — Progress Notes (Signed)
BH MD/PA/NP OP Progress Note  Virtual Visit via Video Note  I connected with Alexandria Stokes on 09/01/20 at 11:00 AM EST by a video enabled telemedicine application and verified that I am speaking with the correct person using two identifiers.  Location: Patient: Home Provider: Clinic   I discussed the limitations of evaluation and management by telemedicine and the availability of in person appointments. The patient expressed understanding and agreed to proceed.  Follow Up Instructions:  I discussed the assessment and treatment plan with the patient. The patient was provided an opportunity to ask questions and all were answered. The patient agreed with the plan and demonstrated an understanding of the instructions.   The patient was advised to call back or seek an in-person evaluation if the symptoms worsen or if the condition fails to improve as anticipated.  I provided 22 minutes of non-face-to-face time during this encounter.  Meta Hatchet, PA   09/01/2020 11:20 PM Alexandria Stokes  MRN:  [id number]  Chief Complaint: Follow up and medication management   HPI:   Alexandria Stokes is a 19 year old female with a past psychiatric history significant for major depressive disorder and generalized anxiety disorder who presents to [facility] for medication management.  Patient is currently being managed on the following medications:  Sertraline 50 mg daily Hydroxyzine 25 mg 3 times daily as needed  Patient reports that her medication regimen has been going well.  Patient denies any adverse side effects from her medications.  Patient reports that she is still having issues with her anxiety.  Patient states that her hydroxyzine provides her with momentary relief from her anxiety but it wears off quickly.  Patient states that her Hydroxyzine last for roughly an hour. Patient states that she also experiences drowsiness and irritability after taking her  hydroxyzine.  Patient rates her current anxiety a 5 out of 10.  Patient attributes stressors to being out in public, iteracting with family, and recently obtaining a new job.  Patient states that her mood has been pretty good. 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 Patient denies suicidal and homicidal ideations.  She further denies auditory or visual hallucinations. Patient endorses good sleep and receives on average 5 to 6 hours of sleep each night. Patient states that she feels pretty energized upon waking. Patient endorses good appetite and eats on average 3 meals per day.  Patient denies alcohol consumption, tobacco use, and illicit drug use.  Patient is requesting a refill on her Sertraline following the conclusion of the encounter.  Visit Diagnosis:    ICD-10-CM   1. GAD (generalized anxiety disorder)  F41.1 sertraline (ZOLOFT) 50 MG tablet    busPIRone (BUSPAR) 5 MG tablet  2. Severe recurrent major depression without psychotic features (HCC)  F33.2 sertraline (ZOLOFT) 50 MG tablet    Past Psychiatric History:  Generalized anxiety disorder Depression  Past Medical History:  Past Medical History:  Diagnosis Date  . Anxiety   . Depression   . Suicide attempt (HCC)    No past surgical history on file.  Family Psychiatric History:  Patient states that anxiety and depression exist on both sides of the family.  Family History: No family history on file.  Social History:  Social History   Socioeconomic History  . Marital status: Single    Spouse name: Not on file  . Number of children: Not on file  .  Years of education: Not on file  . Highest education level: Not on file  Occupational History  . Not on file  Tobacco Use  . Smoking status: Never Smoker  . Smokeless tobacco: Never Used  Substance and Sexual Activity  . Alcohol use: No  . Drug use: No  . Sexual activity: Not on file  Other Topics Concern  . Not  on file  Social History Narrative  . Not on file   Social Determinants of Health   Financial Resource Strain: Not on file  Food Insecurity: Not on file  Transportation Needs: Not on file  Physical Activity: Not on file  Stress: Not on file  Social Connections: Not on file    Allergies: No Known Allergies  Metabolic Disorder Labs: Lab Results  Component Value Date   HGBA1C 4.9 06/26/2020   MPG 94 06/26/2020   No results found for: PROLACTIN Lab Results  Component Value Date   CHOL 198 06/26/2020   TRIG 54 06/26/2020   HDL 75 06/26/2020   CHOLHDL 2.6 06/26/2020   VLDL 11 06/26/2020   LDLCALC 112 (H) 06/26/2020   Lab Results  Component Value Date   TSH 2.108 06/26/2020    Therapeutic Level Labs: No results found for: LITHIUM No results found for: VALPROATE No components found for:  CBMZ  Current Medications: Current Outpatient Medications  Medication Sig Dispense Refill  . busPIRone (BUSPAR) 5 MG tablet Take 1 tablet (5 mg total) by mouth in the morning and at bedtime. 60 tablet 1  . hydrOXYzine (ATARAX/VISTARIL) 25 MG tablet Take 1 tablet (25 mg total) by mouth 3 (three) times daily as needed for anxiety. 30 tablet 0  . naproxen (NAPROSYN) 250 MG tablet Take 250 mg by mouth once.    . sertraline (ZOLOFT) 50 MG tablet Take 1 tablet (50 mg total) by mouth daily. 30 tablet 0  . SPRINTEC 28 0.25-35 MG-MCG tablet Take 1 tablet by mouth daily.     No current facility-administered medications for this visit.     Musculoskeletal: Strength & Muscle Tone:  Unable to assess due to telemedicine visit Gait & Station: Unable to assess due to telemedicine visit Patient leans: Unable to assess due to telemedicine visit  Psychiatric Specialty Exam: Review of Systems  Psychiatric/Behavioral: Negative for decreased concentration, dysphoric mood, hallucinations, self-injury, sleep disturbance and suicidal ideas. The patient is nervous/anxious. The patient is not hyperactive.      There were no vitals taken for this visit.There is no height or weight on file to calculate BMI.  General Appearance: Well Groomed  Eye Contact:  Good  Speech:  Clear and Coherent and Normal Rate  Volume:  Normal  Mood:  Euthymic  Affect:  Appropriate  Thought Process:  Coherent, Goal Directed and Descriptions of Associations: Intact  Orientation:  Full (Time, Place, and Person)  Thought Content: WDL and Logical   Suicidal Thoughts:  No  Homicidal Thoughts:  No  Memory:  Immediate;   Good Recent;   Good Remote;   Good  Judgement:  Good  Insight:  Good  Psychomotor Activity:  Normal  Concentration:  Concentration: Good and Attention Span: Good  Recall:  Good  Fund of Knowledge: Good  Language: Good  Akathisia:  NA  Handed:  Right  AIMS (if indicated): not done  Assets:  Communication Skills Desire for Improvement Housing Vocational/Educational  ADL's:  Intact  Cognition: WNL  Sleep:  Good   Screenings: AIMS   Flowsheet Row [facility]  AIMS Total Score 0    AUDIT   Flowsheet Row [facility]  Alcohol Use Disorder Identification Test Final Score (AUDIT) 0       Assessment and Plan:  Alexandria Stokes is a 19 year old female with a past psychiatric history significant for major depressive disorder and generalized anxiety disorder who presents to [facility] for medication management.  Patient's main concern today is the management of her anxiety.  Patient states that her hydroxyzine has been helpful but it wears off quickly.  Patient rates her current anxiety a 5 out of 10.  Patient was recommended being placed on BuSpar 5 mg 2 times daily for the management of anxiety.  Patient was agreeable to recommendation.  Patient's medications will be e-prescribed to pharmacy of choice.  1. GAD  (generalized anxiety disorder)  - sertraline (ZOLOFT) 50 MG tablet; Take 1 tablet (50 mg total) by mouth daily.  Dispense: 30 tablet; Refill: 0 - busPIRone (BUSPAR) 5 MG tablet; Take 1 tablet (5 mg total) by mouth in the morning and at bedtime.  Dispense: 60 tablet; Refill: 1  2. Severe recurrent major depression without psychotic features (HCC)  - sertraline (ZOLOFT) 50 MG tablet; Take 1 tablet (50 mg total) by mouth daily.  Dispense: 30 tablet; Refill: 0  Patient to follow up in 6 weeks  Meta Hatchet, PA 09/01/2020, 11:20 PM

## 2020-09-15 ENCOUNTER — Encounter (HOSPITAL_COMMUNITY): Payer: Self-pay

## 2020-09-15 ENCOUNTER — Other Ambulatory Visit: Payer: Self-pay

## 2020-09-15 ENCOUNTER — Ambulatory Visit (INDEPENDENT_AMBULATORY_CARE_PROVIDER_SITE_OTHER): Payer: No Payment, Other | Admitting: Clinical

## 2020-09-15 DIAGNOSIS — F411 Generalized anxiety disorder: Secondary | ICD-10-CM

## 2020-09-15 NOTE — Progress Notes (Signed)
   THERAPIST PROGRESS NOTE  Session Time: 30 minutes  Participation Level: Active  Behavioral Response: CasualAlertEuthymic  Type of Therapy: Individual Therapy  Treatment Goals addressed: Anxiety  Interventions: CBT  Summary:  Alexandria Stokes is a 20 y.o. female who presents for the scheduled session oriented times five, appropriately dressed, and friendly. Client denied hallucinations and delusions. Client reported on today she is doing well. Client reported since the last session she recently started school since last Monday. Client reported she is nervous about doing online class. Client reported last semester she dropped a class because a teacher was not understanding of her hospitalization but this semester seems to have better professors. Client reported since the last session she has been working on removing "toxic" people from her life distancing herself from some friends and family. Client reported there are older family members that belittle about her mental health as well as her friend. Client reported she has tried to tell them about how she feels and explain how they can be supportive without their reciprocity. Client reported she feels better for making that separation of expectations from them. Client reported on the other hand her social anxiety has improved quite a bit. Client reported she took it upon herself to pick a unfamiliar stores and walking around without intent compared to her usual routine of writing a list and planning ahead her whereabouts in the store. Client reported she rates her comfortable in a store at a "7" with 10 being most comfortable. Client reported her job has also been going very well. Client reported her coworkers have made her job enjoyable and she is opening up more. Client engaged with the therapist to discuss "analyzing her thoughts" and noticing "unhelpful thinking styles". Client reported she struggles with knowing how to love herself and notices  she at times has thoughts of "you can't talk to them" out of fear.    Suicidal/Homicidal: Nowithout intent/plan  Therapist Response:  Therapist began the session checking in and asking the client how she has been doing since last seen. Therapist actively listened to the clients thoughts and feelings. Therapist engaged with the client asking open ended questions about changes she has tried to make to help her symptoms. Therapist used CBT to discuss analyzing her thoughts and identifying unhelpful thinking styles to help control her impulse to react off of emotion before thinking her way through a situation. Therapist provided the client was two Psycho educational worksheets to read in addition to what was discussed during the session.  Client was scheduled for next session.    Plan: Return again in 6 weeks for individual therapy.  Diagnosis: Generalized anxiety disorder    Neena Rhymes Ebony Yorio, LCSW 09/15/2020

## 2020-10-05 ENCOUNTER — Encounter (HOSPITAL_COMMUNITY): Payer: Self-pay | Admitting: Physician Assistant

## 2020-10-11 ENCOUNTER — Telehealth (HOSPITAL_COMMUNITY): Payer: Self-pay | Admitting: *Deleted

## 2020-10-11 NOTE — Telephone Encounter (Signed)
Rx REFILL REQUEST  sertraline (ZOLOFT) 50 MG tablet 30 tablet 0 09/01/2020

## 2020-10-12 ENCOUNTER — Other Ambulatory Visit (HOSPITAL_COMMUNITY): Payer: Self-pay | Admitting: Physician Assistant

## 2020-10-12 DIAGNOSIS — F411 Generalized anxiety disorder: Secondary | ICD-10-CM

## 2020-10-12 DIAGNOSIS — F332 Major depressive disorder, recurrent severe without psychotic features: Secondary | ICD-10-CM

## 2020-10-12 MED ORDER — SERTRALINE HCL 50 MG PO TABS: 50.0000 mg | ORAL_TABLET | Freq: Every day | ORAL | 1 refills | Status: DC

## 2020-10-12 NOTE — Telephone Encounter (Signed)
Provider was contacted by Rushie Chestnut, RMA regarding prescription refill request for this patient. Patient's medication was e-prescribed to pharmacy of choice.

## 2020-10-12 NOTE — Progress Notes (Signed)
Provider was contacted by Alexandria Stokes, RMA for prescription refill request. Patient's medication to be e-prescribed to pharmacy of choice.

## 2020-10-13 ENCOUNTER — Encounter (HOSPITAL_COMMUNITY): Payer: Self-pay | Admitting: Physician Assistant

## 2020-10-13 ENCOUNTER — Ambulatory Visit (INDEPENDENT_AMBULATORY_CARE_PROVIDER_SITE_OTHER): Payer: No Payment, Other | Admitting: Clinical

## 2020-10-13 ENCOUNTER — Other Ambulatory Visit: Payer: Self-pay

## 2020-10-13 ENCOUNTER — Telehealth (INDEPENDENT_AMBULATORY_CARE_PROVIDER_SITE_OTHER): Payer: No Payment, Other | Admitting: Physician Assistant

## 2020-10-13 DIAGNOSIS — F332 Major depressive disorder, recurrent severe without psychotic features: Secondary | ICD-10-CM | POA: Diagnosis not present

## 2020-10-13 DIAGNOSIS — G479 Sleep disorder, unspecified: Secondary | ICD-10-CM

## 2020-10-13 DIAGNOSIS — F411 Generalized anxiety disorder: Secondary | ICD-10-CM | POA: Diagnosis not present

## 2020-10-13 MED ORDER — HYDROXYZINE HCL 25 MG PO TABS
25.0000 mg | ORAL_TABLET | Freq: Every evening | ORAL | 1 refills | Status: DC | PRN
Start: 2020-10-13 — End: 2020-11-24

## 2020-10-13 MED ORDER — BUSPIRONE HCL 5 MG PO TABS: 5.0000 mg | ORAL_TABLET | Freq: Two times a day (BID) | ORAL | 1 refills | Status: DC

## 2020-10-13 MED ORDER — SERTRALINE HCL 50 MG PO TABS: 50.0000 mg | ORAL_TABLET | Freq: Every day | ORAL | 1 refills | Status: DC

## 2020-10-13 NOTE — Progress Notes (Signed)
BH MD/PA/NP OP Progress Note  Virtual Visit via Video Note  I connected with Alexandria Stokes on 10/13/20 at  9:00 AM EST by a video enabled telemedicine application and verified that I am speaking with the correct person using two identifiers.  Location: Patient: Home Provider: Clinic   I discussed the limitations of evaluation and management by telemedicine and the availability of in person appointments. The patient expressed understanding and agreed to proceed.  Follow Up Instructions:  I discussed the assessment and treatment plan with the patient. The patient was provided an opportunity to ask questions and all were answered. The patient agreed with the plan and demonstrated an understanding of the instructions.   The patient was advised to call back or seek an in-person evaluation if the symptoms worsen or if the condition fails to improve as anticipated.  I provided 20 minutes of non-face-to-face time during this encounter.   Meta Hatchet, PA   10/13/2020 9:14 AM Alexandria Stokes  MRN:  [id number]  Chief Complaint: Follow up and medication management  HPI:   Alexandria Stokes is a 20 year old female with a past psychiatric history significant for major depressive disorder and generalized anxiety disorder who presents to [facility] for follow up and medication management.  Patient has been managed on the following medications since the last encounter:  Sertraline 50 mg daily Buspirone 5 mg 2 times daily Hydroxyzine 25 mg 3 times daily  Patient reports that she was without her sertraline for roughly 4 days and experienced dizziness and lightheadedness.  Patient is currently back on her sertraline 50 mg daily and reports no adverse side effects.  Patient reports that she has no issues or concerns regarding her current medication regimen.  She reports that her buspirone has been more helpful than hydroxyzine in the management of her  anxiety.  Patient rates her current anxiety a 2 out of 10.  Patient would like to continue taking hydroxyzine 25 mg at bedtime because she has found it helpful with her sleep.  Patient would like refills on all of her current medications.  Patient is pleasant, calm, cooperative, and fully engaged in conversation during the encounter.  Patient reports that her mood has been really great.  Patient reports that she recently was placed on the President's list for academic excellence.  Patient also reports that her new job is going well.  Patient denies suicidal and homicidal ideations.  She further denies auditory or visual hallucinations.  Patient endorses good sleep and receives on average 6 to 7 hours of sleep each night.  Patient endorses good appetite and eats on average 3 - 4 meals each day.  Patient denies alcohol consumption, tobacco use, and illicit drug use.  Visit Diagnosis:    ICD-10-CM   1. GAD (generalized anxiety disorder)  F41.1   2. Severe recurrent major depression without psychotic features (HCC)  F33.2   3. Sleep disturbances  G47.9     Past Psychiatric History: Generalized anxiety disorder Depression  Past Medical History:  Past Medical History:  Diagnosis Date  . Anxiety   . Depression   . Suicide attempt (HCC)    No past surgical history on file.  Family Psychiatric History:  Patient states that anxiety and depression exist on both sides of the family  Family History: No family history on file.  Social History:  Social History   Socioeconomic History  . Marital status: Single    Spouse name: Not on  file  . Number of children: Not on file  . Years of education: Not on file  . Highest education level: Not on file  Occupational History  . Not on file  Tobacco Use  . Smoking status: Never Smoker  . Smokeless tobacco: Never Used  Substance and Sexual Activity  . Alcohol use: No  . Drug use: No  . Sexual activity: Not on file  Other Topics Concern  . Not on  file  Social History Narrative  . Not on file   Social Determinants of Health   Financial Resource Strain: Not on file  Food Insecurity: Not on file  Transportation Needs: Not on file  Physical Activity: Not on file  Stress: Not on file  Social Connections: Not on file    Allergies: No Known Allergies  Metabolic Disorder Labs: Lab Results  Component Value Date   HGBA1C 4.9 06/26/2020   MPG 94 06/26/2020   No results found for: PROLACTIN Lab Results  Component Value Date   CHOL 198 06/26/2020   TRIG 54 06/26/2020   HDL 75 06/26/2020   CHOLHDL 2.6 06/26/2020   VLDL 11 06/26/2020   LDLCALC 112 (H) 06/26/2020   Lab Results  Component Value Date   TSH 2.108 06/26/2020    Therapeutic Level Labs: No results found for: LITHIUM No results found for: VALPROATE No components found for:  CBMZ  Current Medications: Current Outpatient Medications  Medication Sig Dispense Refill  . busPIRone (BUSPAR) 5 MG tablet Take 1 tablet (5 mg total) by mouth in the morning and at bedtime. 60 tablet 1  . hydrOXYzine (ATARAX/VISTARIL) 25 MG tablet Take 1 tablet (25 mg total) by mouth 3 (three) times daily as needed for anxiety. 30 tablet 0  . naproxen (NAPROSYN) 250 MG tablet Take 250 mg by mouth once.    . sertraline (ZOLOFT) 50 MG tablet Take 1 tablet (50 mg total) by mouth daily. 30 tablet 1  . SPRINTEC 28 0.25-35 MG-MCG tablet Take 1 tablet by mouth daily.     No current facility-administered medications for this visit.      Musculoskeletal: Strength & Muscle Tone: Unable to assess to due to telemedicine visit Gait & Station: Unable to assess to due to telemedicine visit Patient leans: Unable to assess to due to telemedicine visit  Psychiatric Specialty Exam: Review of Systems  Psychiatric/Behavioral: Negative for decreased concentration, dysphoric mood, hallucinations, self-injury, sleep disturbance and suicidal ideas. The patient is not nervous/anxious and is not hyperactive.      There were no vitals taken for this visit.There is no height or weight on file to calculate BMI.  General Appearance: Well Groomed  Eye Contact:  Good  Speech:  Clear and Coherent and Normal Rate  Volume:  Normal  Mood:  Euthymic  Affect:  Appropriate  Thought Process:  Coherent, Goal Directed and Descriptions of Associations: Intact  Orientation:  Full (Time, Place, and Person)  Thought Content: WDL   Suicidal Thoughts:  No  Homicidal Thoughts:  No  Memory:  Immediate;   Good Recent;   Good Remote;   Good  Judgement:  Good  Insight:  Good  Psychomotor Activity:  Normal  Concentration:  Concentration: Good and Attention Span: Good  Recall:  Good  Fund of Knowledge: Good  Language: Good  Akathisia:  NA  Handed:  Right  AIMS (if indicated): not done  Assets:  Communication Skills Desire for Improvement Housing Vocational/Educational  ADL's:  Intact  Cognition: WNL  Sleep:  Good  Screenings: AIMS   Flowsheet Row [facility]  AIMS Total Score 0    AUDIT   Flowsheet Row [facility]  Alcohol Use Disorder Identification Test Final Score (AUDIT) 0    Flowsheet Row [facility] ED from 06/24/2020 in [facility]  C-SSRS RISK CATEGORY Error: Q3, 4, or 5 should not be populated when Q2 is No High Risk       Assessment and Plan:  Alexandria Stokes is a 20 year old female with a past psychiatric history significant for major depressive disorder and generalized anxiety disorder who presents to [facility] for follow up and medication management.  Patient reports no issues or concerns regarding her current medication regimen.  Patient reports that her buspirone has been very  helpful in the management of her anxiety but would like to continue taking hydroxyzine as needed for bedtime.  Patient is requesting refills on all of her medications.  Patient's medications will be e-prescribed to pharmacy of choice.  1. GAD (generalized anxiety disorder)  - hydrOXYzine (ATARAX/VISTARIL) 25 MG tablet; Take 1 tablet (25 mg total) by mouth at bedtime as needed for anxiety.  Dispense: 30 tablet; Refill: 1 - busPIRone (BUSPAR) 5 MG tablet; Take 1 tablet (5 mg total) by mouth in the morning and at bedtime.  Dispense: 60 tablet; Refill: 1 - sertraline (ZOLOFT) 50 MG tablet; Take 1 tablet (50 mg total) by mouth daily.  Dispense: 30 tablet; Refill: 1  2. Severe recurrent major depression without psychotic features (HCC)  - sertraline (ZOLOFT) 50 MG tablet; Take 1 tablet (50 mg total) by mouth daily.  Dispense: 30 tablet; Refill: 1  3. Sleep disturbances  - hydrOXYzine (ATARAX/VISTARIL) 25 MG tablet; Take 1 tablet (25 mg total) by mouth at bedtime as needed for anxiety.  Dispense: 30 tablet; Refill: 1  Patient to follow-up in 6 weeks  Meta Hatchet, PA 10/13/2020, 9:14 AM

## 2020-10-14 NOTE — Progress Notes (Signed)
   THERAPIST PROGRESS NOTE Virtual Visit via Video Note  I connected with Alexandria Stokes on 10/13/2020 at  8:00 AM EST by a video enabled telemedicine application and verified that I am speaking with the correct person using two identifiers.  Location: Patient: home Provider: office   I discussed the limitations of evaluation and management by telemedicine and the availability of in person appointments. The patient expressed understanding and agreed to proceed.   Follow Up Instructions: I discussed the assessment and treatment plan with the patient. The patient was provided an opportunity to ask questions and all were answered. The patient agreed with the plan and demonstrated an understanding of the instructions.   The patient was advised to call back or seek an in-person evaluation if the symptoms worsen or if the condition fails to improve as anticipated.   Session Time: 25 minutes  Participation Level: Active  Behavioral Response: CasualAlertEuthymic  Type of Therapy: Individual Therapy  Treatment Goals addressed: Anxiety  Interventions: CBT  Summary:  Alexandria Stokes is a 20 y.o. female who presents for the scheduled session oriented times five, appropriately dressed, and friendly. Client denied hallucinations and delusions. Client reported on today she feels significantly better than the last session. Client reported since the last sessio school has been going well and her symptoms of anxiety have improved. Client reported her father has also reached out to her recently to talk and she is contemplating meeting with him the upcoming weekend in a mutual public place. Client reported her expectations are not high due to knowing his cycle of behavior. Client reported her improvement is evidenced by having made more friends at school, her social anxiety is not as disabling being able to do more independently without panic, and improved confidence with validating her own thoughts  and feelings. Client reported the worksheet provided to her at the last appointment has been helpful with deciphering between thoughts and feelings. Client reported she is continuing to work on getting out of old habits.     Suicidal/Homicidal: Nowithout intent/plan  Therapist Response:  Therapist began the session asking how the client has been doing since last seen. Therapist actively listened to the clients thoughts and feelings. Therapist engaged the client asking open ended questions about her new thought process that has enable positive changes. Therapist used CBT to discuss how to maintain gains made by accepting positive and negative emotions as a variation of life and continue to use taught coping skills. Client was scheduled for next appointment.    Plan: Return again in 5 weeks for individual therapy.  Diagnosis: Generalized anxiety disorder   Neena Rhymes Sundae Maners, LCSW 10/13/2020

## 2020-11-24 ENCOUNTER — Other Ambulatory Visit: Payer: Self-pay

## 2020-11-24 ENCOUNTER — Encounter (HOSPITAL_COMMUNITY): Payer: Self-pay | Admitting: Physician Assistant

## 2020-11-24 ENCOUNTER — Telehealth (INDEPENDENT_AMBULATORY_CARE_PROVIDER_SITE_OTHER): Payer: No Payment, Other | Admitting: Physician Assistant

## 2020-11-24 DIAGNOSIS — F330 Major depressive disorder, recurrent, mild: Secondary | ICD-10-CM | POA: Diagnosis not present

## 2020-11-24 DIAGNOSIS — F411 Generalized anxiety disorder: Secondary | ICD-10-CM | POA: Diagnosis not present

## 2020-11-24 DIAGNOSIS — G479 Sleep disorder, unspecified: Secondary | ICD-10-CM

## 2020-11-24 MED ORDER — HYDROXYZINE HCL 10 MG PO TABS: 10.0000 mg | ORAL_TABLET | Freq: Every evening | ORAL | 2 refills | Status: DC | PRN

## 2020-11-24 MED ORDER — SERTRALINE HCL 50 MG PO TABS: 50.0000 mg | ORAL_TABLET | Freq: Every day | ORAL | 2 refills | Status: DC

## 2020-11-24 MED ORDER — BUSPIRONE HCL 5 MG PO TABS: 5.0000 mg | ORAL_TABLET | Freq: Two times a day (BID) | ORAL | 2 refills | Status: DC

## 2020-11-24 NOTE — Progress Notes (Signed)
BH MD/PA/NP OP Progress Note  Virtual Visit via Video Note  I connected with Alexandria Stokes on 11/24/20 at  9:00 AM EDT by a video enabled telemedicine application and verified that I am speaking with the correct person using two identifiers.  Location: Patient: Clinic Provider: Home   I discussed the limitations of evaluation and management by telemedicine and the availability of in person appointments. The patient expressed understanding and agreed to proceed.  Follow Up Instructions:  I discussed the assessment and treatment plan with the patient. The patient was provided an opportunity to ask questions and all were answered. The patient agreed with the plan and demonstrated an understanding of the instructions.   The patient was advised to call back or seek an in-person evaluation if the symptoms worsen or if the condition fails to improve as anticipated.  I provided 20 minutes of non-face-to-face time during this encounter.  Meta Hatchet, PA   11/24/2020 9:21 AM Alexandria Stokes  MRN:  [id number]  Chief Complaint: Follow up and medication management  HPI:   Alexandria Stokes is a 20 year old female with a past psychiatric history significant for major depressive disorder and generalized anxiety disorder who presents to [facility] for follow-up and medication management.  Patient is currently being managed on the following medications:  Sertraline 50 mg daily Buspirone 5 mg 2 times daily Hydroxyzine 25 mg at bedtime  Patient reports no issues or concerns with her current regimen of medications.  Patient denies the need for dosage adjustments at this time and is requesting refills following the conclusion of the encounter.  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  Patient has been attending her counseling sessions and states that  they have been very helpful.  Patient endorses the following stressors: school and work.  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  Patient also endorses that her mother has become somewhat overbearing and believes it is due to the patient "leaving the nest soon."  Patient is pleasant, calm, cooperative, and fully engaged in conversation during the encounter.  Patient reports that her mood has been pretty good lately.  Patient denies suicidal or homicidal ideations.  She further denies auditory or visual hallucinations and does not appear to be responding to internal/external stimuli.  Patient endorses good sleep and receives on average 5 to 7 hours of sleep each night.  Patient attributes the nights that she gets 5 hours of sleep on her busy schedule.  Patient is still taking hydroxyzine at bedtime but states that she has been splitting the dose in half due to the drowsiness she experiences upon waking.  Patient endorses good appetite and eats on average 2-3 meals per day.  Patient denies alcohol consumption, tobacco use, and illicit drug use.  Visit Diagnosis:    ICD-10-CM   1. GAD (generalized anxiety disorder)  F41.1 sertraline (ZOLOFT) 50 MG tablet    busPIRone (BUSPAR) 5 MG tablet    hydrOXYzine (ATARAX/VISTARIL) 10 MG tablet  2. Sleep disturbances  G47.9 hydrOXYzine (ATARAX/VISTARIL) 10 MG tablet  3. Mild episode of recurrent major depressive disorder (HCC)  F33.0 sertraline (ZOLOFT) 50 MG tablet    Past Psychiatric History:  Generalized anxiety disorder Major depressive disorder  Past Medical History:  Past Medical History:  Diagnosis Date  . Anxiety   . Depression   .  Suicide attempt [facility])    History reviewed. No pertinent surgical history.  Family Psychiatric History:  Patient states that anxiety and depression exist on both sides of the family  Family History: History reviewed. No pertinent family history.  Social History:   Social History   Socioeconomic History  . Marital status: Single    Spouse name: Not on file  . Number of children: Not on file  . Years of education: Not on file  . Highest education level: Not on file  Occupational History  . Not on file  Tobacco Use  . Smoking status: Never Smoker  . Smokeless tobacco: Never Used  Substance and Sexual Activity  . Alcohol use: No  . Drug use: No  . Sexual activity: Not on file  Other Topics Concern  . Not on file  Social History Narrative  . Not on file   Social Determinants of Health   Financial Resource Strain: Not on file  Food Insecurity: Not on file  Transportation Needs: Not on file  Physical Activity: Not on file  Stress: Not on file  Social Connections: Not on file    Allergies: No Known Allergies  Metabolic Disorder Labs: Lab Results  Component Value Date   HGBA1C 4.9 06/26/2020   MPG 94 06/26/2020   No results found for: PROLACTIN Lab Results  Component Value Date   CHOL 198 06/26/2020   TRIG 54 06/26/2020   HDL 75 06/26/2020   CHOLHDL 2.6 06/26/2020   VLDL 11 06/26/2020   LDLCALC 112 (H) 06/26/2020   Lab Results  Component Value Date   TSH 2.108 06/26/2020    Therapeutic Level Labs: No results found for: LITHIUM No results found for: VALPROATE No components found for:  CBMZ  Current Medications: Current Outpatient Medications  Medication Sig Dispense Refill  . busPIRone (BUSPAR) 5 MG tablet Take 1 tablet (5 mg total) by mouth in the morning and at bedtime. 60 tablet 2  . hydrOXYzine (ATARAX/VISTARIL) 10 MG tablet Take 1 tablet (10 mg total) by mouth at bedtime as needed for anxiety. 30 tablet 2  . naproxen (NAPROSYN) 250 MG tablet Take 250 mg by mouth once.    . sertraline (ZOLOFT) 50 MG tablet Take 1 tablet (50 mg total) by mouth daily. 30 tablet 2  . SPRINTEC 28 0.25-35 MG-MCG tablet Take 1 tablet by mouth daily.     No current facility-administered medications for this visit.      Musculoskeletal: Strength & Muscle Tone: Unable to assess due to telemedicine visit Gait & Station: Unable to assess due to telemedicine visit Patient leans: Unable to assess due to telemedicine visit  Psychiatric Specialty Exam: Review of Systems  Psychiatric/Behavioral: Positive for sleep disturbance (Patient reports mild sleep disturbances due to her busy schedule). Negative for agitation, decreased concentration, dysphoric mood, hallucinations, self-injury and suicidal ideas. The patient is nervous/anxious. The patient is not hyperactive.     There were no vitals taken for this visit.There is no height or weight on file to calculate BMI.  General Appearance: Well Groomed  Eye Contact:  Good  Speech:  Clear and Coherent and Normal Rate  Volume:  Normal  Mood:  Anxious and Euthymic  Affect:  Appropriate and Congruent  Thought Process:  Coherent, Goal Directed and Descriptions of Associations: Intact  Orientation:  Full (Time, Place, and Person)  Thought Content: WDL and Logical   Suicidal Thoughts:  No  Homicidal Thoughts:  No  Memory:  Immediate;   Good Recent;  Good Remote;   Good  Judgement:  Good  Insight:  Good  Psychomotor Activity:  Normal  Concentration:  Concentration: Good and Attention Span: Good  Recall:  Good  Fund of Knowledge: Good  Language: Good  Akathisia:  NA  Handed:  Right  AIMS (if indicated): not done  Assets:  Communication Skills Desire for Improvement Housing Social Support Vocational/Educational  ADL's:  Intact  Cognition: WNL  Sleep:  Good   Screenings: AIMS   Flowsheet Row [facility]  AIMS Total Score 0    AUDIT   Flowsheet Row [facility]  Alcohol Use Disorder Identification Test Final Score (AUDIT) 0    GAD-7   Flowsheet Row Video Visit from 11/24/2020 in [facility]  Total GAD-7 Score 8    PHQ2-9   Flowsheet Row Video Visit from 11/24/2020 in [facility]  PHQ-2 Total Score 2  PHQ-9 Total Score 5    Flowsheet Row Video Visit from 11/24/2020 in [facility] ED from 06/24/2020 in [facility] EMERGENCY DEPARTMENT  C-SSRS RISK CATEGORY Moderate Risk Error: Q3, 4, or 5 should not be populated when Q2 is No High Risk       Assessment and Plan:   Alexandria Stokes is a 20 year old female with a past psychiatric history significant for major depressive disorder and generalized anxiety disorder who presents to [facility] for follow-up and medication management.  Patient reports no issues or concerns regarding her current medication regimen.  Patient does endorse that she takes half of her hydroxyzine 25 mg before bedtime due to the drowsiness she experiences upon waking.  Patient was recommended taking hydroxyzine 10 mg at bedtime to help manage her anxiety and sleep and decrease the frequency in which she experiences drowsiness.  Patient was agreeable to recommendation.  Patient's medications will be e-prescribed to pharmacy of choice.  1. GAD (generalized anxiety disorder)  - sertraline (ZOLOFT) 50 MG tablet; Take 1 tablet (50 mg total) by mouth daily.  Dispense: 30 tablet; Refill: 2 - busPIRone (BUSPAR) 5 MG tablet; Take 1 tablet (5 mg total) by mouth in the morning and at bedtime.  Dispense: 60 tablet; Refill: 2 - hydrOXYzine (ATARAX/VISTARIL) 10 MG tablet; Take 1 tablet (10 mg total) by mouth at bedtime as needed for anxiety.  Dispense: 30 tablet; Refill: 2  2. Sleep disturbances  - hydrOXYzine (ATARAX/VISTARIL) 10 MG tablet; Take 1 tablet (10 mg total) by mouth at bedtime as needed for anxiety.  Dispense: 30 tablet; Refill: 2  3.  Mild episode of recurrent major depressive disorder (HCC)  - sertraline (ZOLOFT) 50 MG tablet; Take 1 tablet (50 mg total) by mouth daily.  Dispense: 30 tablet; Refill: 2  Patient to follow-up in 2 months  Meta Hatchet, PA 11/24/2020, 9:21 AM

## 2020-11-29 ENCOUNTER — Other Ambulatory Visit: Payer: Self-pay

## 2020-11-29 ENCOUNTER — Ambulatory Visit (HOSPITAL_COMMUNITY): Payer: No Payment, Other | Admitting: Clinical

## 2020-12-27 ENCOUNTER — Ambulatory Visit (INDEPENDENT_AMBULATORY_CARE_PROVIDER_SITE_OTHER): Payer: No Payment, Other | Admitting: Clinical

## 2020-12-27 ENCOUNTER — Other Ambulatory Visit: Payer: Self-pay

## 2020-12-27 DIAGNOSIS — F411 Generalized anxiety disorder: Secondary | ICD-10-CM

## 2020-12-27 NOTE — Progress Notes (Signed)
   THERAPIST PROGRESS NOTE Virtual Visit via Video Note  I connected with Alexandria Stokes on 12/27/20 at  8:00 AM EDT by a video enabled telemedicine application and verified that I am speaking with the correct person using two identifiers.  Location: Patient: home Provider: office   I discussed the limitations of evaluation and management by telemedicine and the availability of in person appointments. The patient expressed understanding and agreed to proceed.   Follow Up Instructions: I discussed the assessment and treatment plan with the patient. The patient was provided an opportunity to ask questions and all were answered. The patient agreed with the plan and demonstrated an understanding of the instructions.   The patient was advised to call back or seek an in-person evaluation if the symptoms worsen or if the condition fails to improve as anticipated.   Session Time: 16 minutes  Participation Level: Active  Behavioral Response: CasualAlertEuthymic  Type of Therapy: Individual Therapy  Treatment Goals addressed: Anxiety  Interventions: CBT  Summary:  Alexandria Stokes is a 20 y.o. female who presents for the scheduled session oriented times five, appropriately dressed and friendly. Client denied hallucinations and delusions. Client reported on today she is doing well. Client reported since the last session she has completed her general studies classes at [employer]. 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 Client reported her anxiety has gone down significantly. Client reported her social anxiety has gotten a lot better and now describes her feelings of anxiousness as "situational". Client reported she continues to work at [employer] and says the job has helped her with social anxiety having to interact with customers. 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 Client reported she took initiative to join a social media group of students that are meeting before transferring to [employer]. Client reported she has been able to meet a lot of people. Client reported she did keep her last lunch with her dad and they were able to talk about some things. Client reported they still don't have a great relationship but they are able to communicate.   Suicidal/Homicidal: Nowithout intent/plan  Therapist Response:  Therapist began the session checking in asking how she has been doing since the last session.  Therapist used positive emotional support, active listening and eye contact while she discussed her thoughts and feelings. Therapist used CBT and asked the client open ended questions about her thought process before compares to currently while working to challenge triggers. Therapist used CBT to reinforce the clients use of exposing herself to situations that her anxiety would have normally kept her from. Therapist assigned the client homework to continue those practices. Client was scheduled for next appointment.    Plan: Return again in 5 weeks.  Diagnosis: Generalized anxiety disorder  Alexandria Rhymes Yordy Matton, LCSW 12/27/2020

## 2021-01-19 ENCOUNTER — Other Ambulatory Visit: Payer: Self-pay

## 2021-01-19 ENCOUNTER — Encounter (HOSPITAL_COMMUNITY): Payer: Self-pay | Admitting: Physician Assistant

## 2021-01-19 ENCOUNTER — Telehealth (INDEPENDENT_AMBULATORY_CARE_PROVIDER_SITE_OTHER): Payer: No Payment, Other | Admitting: Physician Assistant

## 2021-01-19 DIAGNOSIS — G479 Sleep disorder, unspecified: Secondary | ICD-10-CM | POA: Diagnosis not present

## 2021-01-19 DIAGNOSIS — F330 Major depressive disorder, recurrent, mild: Secondary | ICD-10-CM

## 2021-01-19 DIAGNOSIS — F411 Generalized anxiety disorder: Secondary | ICD-10-CM

## 2021-01-19 MED ORDER — HYDROXYZINE HCL 10 MG PO TABS: 10.0000 mg | ORAL_TABLET | Freq: Every evening | ORAL | 2 refills | Status: DC | PRN

## 2021-01-19 MED ORDER — BUSPIRONE HCL 5 MG PO TABS: 5.0000 mg | ORAL_TABLET | Freq: Two times a day (BID) | ORAL | 2 refills | Status: DC

## 2021-01-19 MED ORDER — SERTRALINE HCL 50 MG PO TABS: 50.0000 mg | ORAL_TABLET | Freq: Every day | ORAL | 2 refills | Status: DC

## 2021-01-19 NOTE — Progress Notes (Signed)
BH MD/PA/NP OP Progress Note  Virtual Visit via Video Note  I connected with Alexandria Stokes on 01/19/21 at  9:00 AM EDT by a video enabled telemedicine application and verified that I am speaking with the correct person using two identifiers.  Location: Patient: Home Provider: Clinic   I discussed the limitations of evaluation and management by telemedicine and the availability of in person appointments. The patient expressed understanding and agreed to proceed.  Follow Up Instructions:  I discussed the assessment and treatment plan with the patient. The patient was provided an opportunity to ask questions and all were answered. The patient agreed with the plan and demonstrated an understanding of the instructions.   The patient was advised to call back or seek an in-person evaluation if the symptoms worsen or if the condition fails to improve as anticipated.  I provided 15 minutes of non-face-to-face time during this encounter.  Meta Hatchet, PA   01/19/2021 9:16 AM Alexandria Stokes  MRN:  [id number]  Chief Complaint: Follow-up and medication management  HPI:   Alexandria Stokes. Seib is a 20 year old female with a past psychiatric history significant for major depressive disorder, generalized anxiety disorder, and sleep disturbances who presents to [facility] via virtual video visit for follow-up and medication management.  Patient is currently being managed on the following medications:  Sertraline 50 mg daily Buspirone 5 mg 2 times daily Hydroxyzine 10 mg at bedtime as needed  Patient reports no issues or concerns regarding her current regimen of medications.  Patient denies the need for dosage adjustments at this time and is requesting refills on all her medications following the conclusion of the encounter.  Patient denies any issues regarding her mental health.  Patient does report that she recently sustained an injury on her finger  while trying to open a can at work.  Patient's injury required 4 stitches.  Patient recently finished her previous semester at school and will be taking summer courses.  A GAD-7 screen was performed with the patient scoring a 3.  Patient is pleasant, calm, cooperative, and fully engaged in conversation during the encounter.  Patient reports that she feels apathetic as she is going for a checkup today regarding her finger and if the checkup does not go as planned, she will have to miss some days at work.  Patient denies suicidal or homicidal ideations.  She further denies auditory or visual hallucinations and does not appear to be responding to internal/external stimuli.  Patient endorses good sleep and receives on average 7 hours of sleep each night.  Patient endorses good appetite and eats on average 2-3 meals per day.  Patient denies alcohol consumption, tobacco use, and illicit drug use.   Visit Diagnosis:    ICD-10-CM   1. GAD (generalized anxiety disorder)  F41.1 hydrOXYzine (ATARAX/VISTARIL) 10 MG tablet    busPIRone (BUSPAR) 5 MG tablet    sertraline (ZOLOFT) 50 MG tablet  2. Sleep disturbances  G47.9 hydrOXYzine (ATARAX/VISTARIL) 10 MG tablet  3. Mild episode of recurrent major depressive disorder (HCC)  F33.0 sertraline (ZOLOFT) 50 MG tablet    Past Psychiatric History:  Generalized anxiety disorder Major depressive disorder  Past Medical History:  Past Medical History:  Diagnosis Date  . Anxiety   . Depression   . Suicide attempt [facility])    History reviewed. No pertinent surgical history.  Family Psychiatric History:  Patient states that anxiety and depression exist on both sides of the family  Family History: History reviewed. No pertinent family history.  Social History:  Social History   Socioeconomic History  . Marital status: Single    Spouse name: Not on file  . Number of children: Not on file  . Years of education: Not on file  . Highest education level: Not on  file  Occupational History  . Not on file  Tobacco Use  . Smoking status: Never Smoker  . Smokeless tobacco: Never Used  Substance and Sexual Activity  . Alcohol use: No  . Drug use: No  . Sexual activity: Not on file  Other Topics Concern  . Not on file  Social History Narrative  . Not on file   Social Determinants of Health   Financial Resource Strain: Not on file  Food Insecurity: Not on file  Transportation Needs: Not on file  Physical Activity: Not on file  Stress: Not on file  Social Connections: Not on file    Allergies: No Known Allergies  Metabolic Disorder Labs: Lab Results  Component Value Date   HGBA1C 4.9 06/26/2020   MPG 94 06/26/2020   No results found for: PROLACTIN Lab Results  Component Value Date   CHOL 198 06/26/2020   TRIG 54 06/26/2020   HDL 75 06/26/2020   CHOLHDL 2.6 06/26/2020   VLDL 11 06/26/2020   LDLCALC 112 (H) 06/26/2020   Lab Results  Component Value Date   TSH 2.108 06/26/2020    Therapeutic Level Labs: No results found for: LITHIUM No results found for: VALPROATE No components found for:  CBMZ  Current Medications: Current Outpatient Medications  Medication Sig Dispense Refill  . busPIRone (BUSPAR) 5 MG tablet Take 1 tablet (5 mg total) by mouth in the morning and at bedtime. 60 tablet 2  . hydrOXYzine (ATARAX/VISTARIL) 10 MG tablet Take 1 tablet (10 mg total) by mouth at bedtime as needed for anxiety. 30 tablet 2  . naproxen (NAPROSYN) 250 MG tablet Take 250 mg by mouth once.    . sertraline (ZOLOFT) 50 MG tablet Take 1 tablet (50 mg total) by mouth daily. 30 tablet 2  . SPRINTEC 28 0.25-35 MG-MCG tablet Take 1 tablet by mouth daily.     No current facility-administered medications for this visit.     Musculoskeletal: Strength & Muscle Tone: Unable to assess due to telemedicine visit Gait & Station: Unable to assess due to telemedicine visit Patient leans: Unable to assess due to telemedicine visit  Psychiatric  Specialty Exam: Review of Systems  Psychiatric/Behavioral: Negative for decreased concentration, dysphoric mood, hallucinations, self-injury, sleep disturbance and suicidal ideas. The patient is not nervous/anxious and is not hyperactive.     There were no vitals taken for this visit.There is no height or weight on file to calculate BMI.  General Appearance: Well Groomed  Eye Contact:  Good  Speech:  Clear and Coherent and Normal Rate  Volume:  Normal  Mood:  Euthymic  Affect:  Appropriate  Thought Process:  Coherent and Descriptions of Associations: Intact  Orientation:  Full (Time, Place, and Person)  Thought Content: WDL   Suicidal Thoughts:  No  Homicidal Thoughts:  No  Memory:  Immediate;   Good Recent;   Good Remote;   Good  Judgement:  Good  Insight:  Good  Psychomotor Activity:  Normal  Concentration:  Concentration: Good and Attention Span: Good  Recall:  Good  Fund of Knowledge: Good  Language: Good  Akathisia:  NA  Handed:  Right  AIMS (if indicated): not  done  Assets:  Communication Skills Desire for Improvement Housing Social Support Vocational/Educational  ADL's:  Intact  Cognition: WNL  Sleep:  Good   Screenings: AIMS   Flowsheet Row [facility]  AIMS Total Score 0    AUDIT   Flowsheet Row [facility]  Alcohol Use Disorder Identification Test Final Score (AUDIT) 0    GAD-7   Flowsheet Row Video Visit from 01/19/2021 in [facility] Video Visit from 11/24/2020 in [facility]  Total GAD-7 Score 3 8    PHQ2-9   Flowsheet Row Video Visit from 01/19/2021 in [facility] Video Visit from 11/24/2020 in [facility]  PHQ-2 Total Score 0 2  PHQ-9 Total Score -- 5    Flowsheet Row Video Visit from  01/19/2021 in [facility] Video Visit from 11/24/2020 in [facility]  C-SSRS RISK CATEGORY Moderate Risk Moderate Risk Error: Q3, 4, or 5 should not be populated when Q2 is No       Assessment and Plan:   Alexandria Stokes. Slates is a 20 year old female with a past psychiatric history significant for major depressive disorder, generalized anxiety disorder, and sleep disturbances who presents to [facility] via virtual video visit for follow-up and medication management. Patient reports no issues or concerns regarding her current medication regimen.  Patient denies the need for dosage adjustments at this time and is requesting refills on all her medications.  Patient's medications to be e-prescribed to pharmacy of choice.  1. GAD (generalized anxiety disorder)  - hydrOXYzine (ATARAX/VISTARIL) 10 MG tablet; Take 1 tablet (10 mg total) by mouth at bedtime as needed for anxiety.  Dispense: 30 tablet; Refill: 2 - busPIRone (BUSPAR) 5 MG tablet; Take 1 tablet (5 mg total) by mouth in the morning and at bedtime.  Dispense: 60 tablet; Refill: 2 - sertraline (ZOLOFT) 50 MG tablet; Take 1 tablet (50 mg total) by mouth daily.  Dispense: 30 tablet; Refill: 2  2. Sleep disturbances  - hydrOXYzine (ATARAX/VISTARIL) 10 MG tablet; Take 1 tablet (10 mg total) by mouth at bedtime as needed for anxiety.  Dispense: 30 tablet; Refill: 2  3. Mild episode of recurrent major depressive disorder (HCC)  - sertraline (ZOLOFT) 50 MG tablet; Take 1 tablet (50 mg total) by mouth daily.  Dispense: 30 tablet; Refill: 2  Patient to follow-up in 3 months  Meta Hatchet, PA 01/19/2021, 9:16 AM

## 2021-03-28 ENCOUNTER — Other Ambulatory Visit (HOSPITAL_COMMUNITY): Payer: Self-pay | Admitting: Physician Assistant

## 2021-03-28 ENCOUNTER — Telehealth (HOSPITAL_COMMUNITY): Payer: Self-pay | Admitting: *Deleted

## 2021-03-28 DIAGNOSIS — F411 Generalized anxiety disorder: Secondary | ICD-10-CM

## 2021-03-28 MED ORDER — BUSPIRONE HCL 5 MG PO TABS
5.0000 mg | ORAL_TABLET | Freq: Two times a day (BID) | ORAL | 2 refills | Status: DC
Start: 2021-03-28 — End: 2021-04-05

## 2021-03-28 NOTE — Telephone Encounter (Signed)
Provider was contacted by Rushie Chestnut, RMA regarding a medication refill request. Patient's medication to be e-prescribed to pharmacy of choice.

## 2021-03-28 NOTE — Telephone Encounter (Signed)
Rx Refill Request: busPIRone (BUSPAR) 5 MG tablet 

## 2021-03-28 NOTE — Progress Notes (Signed)
Provider was contacted by Direce E. McIntyre, RMA regarding a medication refill request. Patient's medication to be e-prescribed to pharmacy of choice.

## 2021-04-04 ENCOUNTER — Telehealth (INDEPENDENT_AMBULATORY_CARE_PROVIDER_SITE_OTHER): Payer: No Payment, Other | Admitting: Physician Assistant

## 2021-04-04 DIAGNOSIS — G479 Sleep disorder, unspecified: Secondary | ICD-10-CM

## 2021-04-04 DIAGNOSIS — F411 Generalized anxiety disorder: Secondary | ICD-10-CM | POA: Diagnosis not present

## 2021-04-04 DIAGNOSIS — F330 Major depressive disorder, recurrent, mild: Secondary | ICD-10-CM | POA: Diagnosis not present

## 2021-04-05 ENCOUNTER — Telehealth (HOSPITAL_COMMUNITY): Payer: No Payment, Other | Admitting: Physician Assistant

## 2021-04-05 ENCOUNTER — Encounter (HOSPITAL_COMMUNITY): Payer: Self-pay | Admitting: Physician Assistant

## 2021-04-05 MED ORDER — HYDROXYZINE HCL 10 MG PO TABS: 10.0000 mg | ORAL_TABLET | Freq: Every evening | ORAL | 2 refills | Status: DC | PRN

## 2021-04-05 MED ORDER — BUSPIRONE HCL 5 MG PO TABS
5.0000 mg | ORAL_TABLET | Freq: Two times a day (BID) | ORAL | 2 refills | Status: DC
Start: 2021-04-05 — End: 2021-08-31

## 2021-04-05 MED ORDER — SERTRALINE HCL 50 MG PO TABS: 50.0000 mg | ORAL_TABLET | Freq: Every day | ORAL | 2 refills | Status: DC

## 2021-04-05 NOTE — Progress Notes (Signed)
BH MD/PA/NP OP Progress Note  Virtual Visit via Video Note  I connected with Alexandria Stokes on 04/04/21 at  8:30 AM EDT by a video enabled telemedicine application and verified that I am speaking with the correct person using two identifiers.  Location: Patient: Home Provider: Working remotely   I discussed the limitations of evaluation and management by telemedicine and the availability of in person appointments. The patient expressed understanding and agreed to proceed.  Follow Up Instructions:   I discussed the assessment and treatment plan with the patient. The patient was provided an opportunity to ask questions and all were answered. The patient agreed with the plan and demonstrated an understanding of the instructions.   The patient was advised to call back or seek an in-person evaluation if the symptoms worsen or if the condition fails to improve as anticipated.  I provided 25 minutes of non-face-to-face time during this encounter.  Meta Hatchet, PA    04/04/2021 4:10 PM Alexandria Stokes  MRN:  [id number]  Chief Complaint: Follow up and medication management  HPI:   Alexandria Stokes is a 20 year old female with a past psychiatric history significant for generalized anxiety disorder, sleep disturbances, and major depressive disorder who presents to [facility] via virtual video visit for follow-up and medication management.  Patient is currently being managed on the following medications:  Buspirone 5 mg 2 times daily Hydroxyzine 10 mg 3 times daily as needed Sertraline 50 mg daily  Patient reports no issues or concerns regarding her current medication regimen.  Patient denies the need for dosage adjustments at this time and is requesting refills on all her medications following the conclusion of the encounter.  Patient denies depressive symptoms or anxiety at this time.  Patient denies any new stressors or major life changing  events at this time.  Patient reports that she will soon be starting back at school again and recently acquired housing with 3 of her friends for the school year.  Patient is alert and oriented x4, pleasant, calm, cooperative, and fully engaged in conversation during the encounter.  Patient denies suicidal or homicidal ideations.  She further denies auditory or visual hallucinations.  Patient endorses good sleep and receives on average 8 hours of sleep each night.  Patient endorses good appetite and eats on average 2-3 meals per day.  Patient denies alcohol consumption, tobacco use, and illicit drug use.  Visit Diagnosis:    ICD-10-CM   1. GAD (generalized anxiety disorder)  F41.1 hydrOXYzine (ATARAX/VISTARIL) 10 MG tablet    busPIRone (BUSPAR) 5 MG tablet    sertraline (ZOLOFT) 50 MG tablet    2. Sleep disturbances  G47.9 hydrOXYzine (ATARAX/VISTARIL) 10 MG tablet    3. Mild episode of recurrent major depressive disorder (HCC)  F33.0 sertraline (ZOLOFT) 50 MG tablet      Past Psychiatric History:  Generalized anxiety disorder Major depressive disorder  Past Medical History:  Past Medical History:  Diagnosis Date   Anxiety    Depression    Suicide attempt (HCC)    History reviewed. No pertinent surgical history.  Family Psychiatric History:  Patient states that anxiety and depression exist on both sides of the family  Family History: History reviewed. No pertinent family history.  Social History:  Social History   Socioeconomic History   Marital status: Single    Spouse name: Not on file   Number of children: Not on file   Years of education: Not on  file   Highest education level: Not on file  Occupational History   Not on file  Tobacco Use   Smoking status: Never   Smokeless tobacco: Never  Substance and Sexual Activity   Alcohol use: No   Drug use: No   Sexual activity: Not on file  Other Topics Concern   Not on file  Social History Narrative   Not on file    Social Determinants of Health   Financial Resource Strain: Not on file  Food Insecurity: Not on file  Transportation Needs: Not on file  Physical Activity: Not on file  Stress: Not on file  Social Connections: Not on file    Allergies: No Known Allergies  Metabolic Disorder Labs: Lab Results  Component Value Date   HGBA1C 4.9 06/26/2020   MPG 94 06/26/2020   No results found for: PROLACTIN Lab Results  Component Value Date   CHOL 198 06/26/2020   TRIG 54 06/26/2020   HDL 75 06/26/2020   CHOLHDL 2.6 06/26/2020   VLDL 11 06/26/2020   LDLCALC 112 (H) 06/26/2020   Lab Results  Component Value Date   TSH 2.108 06/26/2020    Therapeutic Level Labs: No results found for: LITHIUM No results found for: VALPROATE No components found for:  CBMZ  Current Medications: Current Outpatient Medications  Medication Sig Dispense Refill   busPIRone (BUSPAR) 5 MG tablet Take 1 tablet (5 mg total) by mouth in the morning and at bedtime. 60 tablet 2   hydrOXYzine (ATARAX/VISTARIL) 10 MG tablet Take 1 tablet (10 mg total) by mouth at bedtime as needed for anxiety. 30 tablet 2   naproxen (NAPROSYN) 250 MG tablet Take 250 mg by mouth once.     sertraline (ZOLOFT) 50 MG tablet Take 1 tablet (50 mg total) by mouth daily. 30 tablet 2   SPRINTEC 28 0.25-35 MG-MCG tablet Take 1 tablet by mouth daily.     No current facility-administered medications for this visit.     Musculoskeletal: Strength & Muscle Tone: Unable to assess due to telemedicine visit Gait & Station: Unable to assess due to telemedicine visit Patient leans: Unable to assess due to telemedicine visit  Psychiatric Specialty Exam: Review of Systems  Psychiatric/Behavioral:  Negative for decreased concentration, dysphoric mood, hallucinations, self-injury, sleep disturbance and suicidal ideas. The patient is not nervous/anxious and is not hyperactive.    There were no vitals taken for this visit.There is no height or  weight on file to calculate BMI.  General Appearance: Well Groomed  Eye Contact:  Good  Speech:  Clear and Coherent and Normal Rate  Volume:  Normal  Mood:  Euthymic  Affect:  Appropriate  Thought Process:  Coherent and Descriptions of Associations: Intact  Orientation:  Full (Time, Place, and Person)  Thought Content: WDL   Suicidal Thoughts:  No  Homicidal Thoughts:  No  Memory:  Immediate;   Good Recent;   Good Remote;   Good  Judgement:  Good  Insight:  Good  Psychomotor Activity:  Normal  Concentration:  Concentration: Good and Attention Span: Good  Recall:  Good  Fund of Knowledge: Good  Language: Good  Akathisia:  NA  Handed:  Right  AIMS (if indicated): not done  Assets:  Communication Skills Desire for Improvement Housing Social Support Vocational/Educational  ADL's:  Intact  Cognition: WNL  Sleep:  Good   Screenings: AIMS    Flowsheet Row [facility]  AIMS Total Score  0      AUDIT    Flowsheet Row Ad[facility]  Alcohol Use Disorder Identification Test Final Score (AUDIT) 0      GAD-7    Flowsheet Row Video Visit from 04/04/2021 in [facility] Video Visit from 01/19/2021 in [facility] Video Visit from 11/24/2020 in [facility]  Total GAD-7 Score 3 3 8       PHQ2-9    Flowsheet Row Video Visit from 04/04/2021 in [facility] Video Visit from 01/19/2021 in [facility] Video Visit from 11/24/2020 in [facility]  PHQ-2 Total Score 0 0 2  PHQ-9 Total Score -- -- 5      Flowsheet Row Video Visit from 04/04/2021 in [facility] Video Visit from 01/19/2021 in [facility] Video Visit from  11/24/2020 in [facility]  C-SSRS RISK CATEGORY Moderate Risk Moderate Risk Moderate Risk        Assessment and Plan:   Alexandria Stokes is a 20 year old female with a past psychiatric history significant for generalized anxiety disorder, sleep disturbances, and major depressive disorder who presents to [facility] via virtual video visit for follow-up and medication management.  Patient reports no issues or concerns regarding her current medication regimen.  Patient denies the need for dosage adjustments at this time and is requesting refills following the conclusion of the encounter.  Patient's medications to be e-prescribed to pharmacy of choice.  1. GAD (generalized anxiety disorder)  - hydrOXYzine (ATARAX/VISTARIL) 10 MG tablet; Take 1 tablet (10 mg total) by mouth at bedtime as needed for anxiety.  Dispense: 30 tablet; Refill: 2 - busPIRone (BUSPAR) 5 MG tablet; Take 1 tablet (5 mg total) by mouth in the morning and at bedtime.  Dispense: 60 tablet; Refill: 2 - sertraline (ZOLOFT) 50 MG tablet; Take 1 tablet (50 mg total) by mouth daily.  Dispense: 30 tablet; Refill: 2  2. Sleep disturbances  - hydrOXYzine (ATARAX/VISTARIL) 10 MG tablet; Take 1 tablet (10 mg total) by mouth at bedtime as needed for anxiety.  Dispense: 30 tablet; Refill: 2  3. Mild episode of recurrent major depressive disorder (HCC)  - sertraline (ZOLOFT) 50 MG tablet; Take 1 tablet (50 mg total) by mouth daily.  Dispense: 30 tablet; Refill: 2  Patient to follow up in 3 months Provider spent a total of 25 minutes with the patient/reviewing patient's chart  [facility], PA 04/04/2021, 4:10 PM

## 2021-07-05 ENCOUNTER — Telehealth (HOSPITAL_COMMUNITY): Payer: No Payment, Other | Admitting: Physician Assistant

## 2021-08-14 ENCOUNTER — Other Ambulatory Visit (HOSPITAL_COMMUNITY): Payer: Self-pay | Admitting: Physician Assistant

## 2021-08-14 DIAGNOSIS — F330 Major depressive disorder, recurrent, mild: Secondary | ICD-10-CM

## 2021-08-14 DIAGNOSIS — F411 Generalized anxiety disorder: Secondary | ICD-10-CM

## 2021-08-31 ENCOUNTER — Telehealth (INDEPENDENT_AMBULATORY_CARE_PROVIDER_SITE_OTHER): Payer: No Payment, Other | Admitting: Physician Assistant

## 2021-08-31 ENCOUNTER — Encounter (HOSPITAL_COMMUNITY): Payer: Self-pay | Admitting: Physician Assistant

## 2021-08-31 DIAGNOSIS — G479 Sleep disorder, unspecified: Secondary | ICD-10-CM | POA: Diagnosis not present

## 2021-08-31 DIAGNOSIS — F411 Generalized anxiety disorder: Secondary | ICD-10-CM | POA: Diagnosis not present

## 2021-08-31 DIAGNOSIS — F330 Major depressive disorder, recurrent, mild: Secondary | ICD-10-CM | POA: Diagnosis not present

## 2021-08-31 MED ORDER — HYDROXYZINE HCL 10 MG PO TABS: 10.0000 mg | ORAL_TABLET | Freq: Every evening | ORAL | 2 refills | Status: DC | PRN

## 2021-08-31 MED ORDER — SERTRALINE HCL 50 MG PO TABS: 50.0000 mg | ORAL_TABLET | Freq: Every day | ORAL | 2 refills | Status: DC

## 2021-08-31 MED ORDER — BUSPIRONE HCL 5 MG PO TABS: 5.0000 mg | ORAL_TABLET | Freq: Two times a day (BID) | ORAL | 2 refills | Status: DC

## 2021-08-31 NOTE — Progress Notes (Signed)
BH MD/PA/NP OP Progress Note  Virtual Visit via Telephone Note  I connected with Alexandria Stokes on 08/31/21 at 11:30 AM EST by telephone and verified that I am speaking with the correct person using two identifiers.  Location: Patient: Home Provider: Clinic   I discussed the limitations, risks, security and privacy concerns of performing an evaluation and management service by telephone and the availability of in person appointments. I also discussed with the patient that there may be a patient responsible charge related to this service. The patient expressed understanding and agreed to proceed.  Follow Up Instructions:   I discussed the assessment and treatment plan with the patient. The patient was provided an opportunity to ask questions and all were answered. The patient agreed with the plan and demonstrated an understanding of the instructions.   The patient was advised to call back or seek an in-person evaluation if the symptoms worsen or if the condition fails to improve as anticipated.  I provided 10 minutes of non-face-to-face time during this encounter.  Meta Hatchet, PA   08/31/2021 6:48 PM Alexandria Stokes  MRN:  [id number]  Chief Complaint: Follow up and medication management  HPI:   Alexandria Stokes is a 20 year old female with a past psychiatric history significant for generalized anxiety disorder, major depressive disorder, and sleep disturbances who presents to [facility] via virtual telephone visit for follow-up and medication management.  Patient is currently being managed on the following medications:  Hydroxyzine 10 mg at bedtime as needed Buspirone 5 mg 2 times daily Sertraline 50 mg daily  Patient reports no issues or concerns regarding her current medication regimen.  Patient denies the need for dosage adjustments at this time and is requesting refills on all of her medications following the conclusion of the  encounter.  Patient denies any depressive symptoms at this time.  She endorses anxiety and rates her anxiety at 6 out of 10.  She attributes her anxiety to recent death of a family member.  Despite her recent loss, patient reports that she is doing well and other aspects of her life.  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  A GAD-7 screen was performed with the patient scoring a 3.  Patient is alert and oriented x4, pleasant, calm, cooperative, and fully engaged in conversation during the encounter.  Patient states that she feels apathetic at the moment but endorses having a good social support system.  Patient denies suicidal or homicidal ideations.  She further denies auditory or visual hallucinations and does not appear to be responding to internal/external stimuli.  Patient endorses fair sleep and receives on average 4 to 5 hours of sleep each night.  Patient endorses good appetite and eats on average 2 meals per day.  Patient denies alcohol consumption, tobacco use, and illicit drug use.  Visit Diagnosis:    ICD-10-CM   1. GAD (generalized anxiety disorder)  F41.1 sertraline (ZOLOFT) 50 MG tablet    hydrOXYzine (ATARAX) 10 MG tablet    busPIRone (BUSPAR) 5 MG tablet    2. Mild episode of recurrent major depressive disorder (HCC)  F33.0 sertraline (ZOLOFT) 50 MG tablet    3. Sleep disturbances  G47.9 hydrOXYzine (ATARAX) 10 MG tablet      Past Psychiatric History:  Generalized anxiety disorder Major depressive disorder  Past Medical History:  Past Medical History:  Diagnosis Date   Anxiety    Depression    Suicide attempt [facility])    History reviewed. No pertinent surgical history.  Family Psychiatric History:  Patient states that anxiety and depression exist on both sides of the family  Family History: History reviewed. No pertinent family history.  Social History:  Social History    Socioeconomic History   Marital status: Single    Spouse name: Not on file   Number of children: Not on file   Years of education: Not on file   Highest education level: Not on file  Occupational History   Not on file  Tobacco Use   Smoking status: Never   Smokeless tobacco: Never  Substance and Sexual Activity   Alcohol use: No   Drug use: No   Sexual activity: Not on file  Other Topics Concern   Not on file  Social History Narrative   Not on file   Social Determinants of Health   Financial Resource Strain: Not on file  Food Insecurity: Not on file  Transportation Needs: Not on file  Physical Activity: Not on file  Stress: Not on file  Social Connections: Not on file    Allergies: No Known Allergies  Metabolic Disorder Labs: Lab Results  Component Value Date   HGBA1C 4.9 06/26/2020   MPG 94 06/26/2020   No results found for: PROLACTIN Lab Results  Component Value Date   CHOL 198 06/26/2020   TRIG 54 06/26/2020   HDL 75 06/26/2020   CHOLHDL 2.6 06/26/2020   VLDL 11 06/26/2020   LDLCALC 112 (H) 06/26/2020   Lab Results  Component Value Date   TSH 2.108 06/26/2020    Therapeutic Level Labs: No results found for: LITHIUM No results found for: VALPROATE No components found for:  CBMZ  Current Medications: Current Outpatient Medications  Medication Sig Dispense Refill   busPIRone (BUSPAR) 5 MG tablet Take 1 tablet (5 mg total) by mouth in the morning and at bedtime. 60 tablet 2   hydrOXYzine (ATARAX) 10 MG tablet Take 1 tablet (10 mg total) by mouth at bedtime as needed for anxiety. 30 tablet 2   naproxen (NAPROSYN) 250 MG tablet Take 250 mg by mouth once.     sertraline (ZOLOFT) 50 MG tablet Take 1 tablet (50 mg total) by mouth daily. 30 tablet 2   SPRINTEC 28 0.25-35 MG-MCG tablet Take 1 tablet by mouth daily.     No current facility-administered medications for this visit.     Musculoskeletal: Strength & Muscle Tone: Unable to assess due to  telemedicine visit Gait & Station: Unable to assess due to telemedicine visit Patient leans: Unable to assess due to telemedicine visit  Psychiatric Specialty Exam: Review of Systems  Psychiatric/Behavioral:  Negative for decreased concentration, dysphoric mood, hallucinations, self-injury, sleep disturbance and suicidal ideas. The patient is nervous/anxious. The patient is not hyperactive.    There were no vitals taken for this visit.There is no height or weight on file to calculate BMI.  General Appearance: Unable to assess due to telemedicine visit  Eye Contact:  Unable to assess due to telemedicine visit  Speech:  Clear and Coherent and Normal Rate  Volume:  Normal  Mood:  Anxious and Euthymic  Affect:  Appropriate and Congruent  Thought Process:  Coherent and Descriptions of Associations: Intact  Orientation:  Full (Time, Place, and Person)  Thought Content: WDL   Suicidal Thoughts:  No  Homicidal Thoughts:  No  Memory:  Immediate;  Good Recent;   Good Remote;   Good  Judgement:  Good  Insight:  Good  Psychomotor Activity:  Normal  Concentration:  Concentration: Good and Attention Span: Good  Recall:  Good  Fund of Knowledge: Good  Language: Good  Akathisia:  NA  Handed:  Right  AIMS (if indicated): not done  Assets:  Communication Skills Desire for Improvement Housing Social Support Vocational/Educational  ADL's:  Intact  Cognition: WNL  Sleep:  Fair   Screenings: AIMS    Flowsheet Row [facility]  AIMS Total Score 0      AUDIT    Flowsheet Row [facility]  Alcohol Use Disorder Identification Test Final Score (AUDIT) 0      GAD-7    Flowsheet Row Video Visit from 08/31/2021 in [facility] Video Visit from 04/04/2021 in [facility] Video Visit from  01/19/2021 in [facility] Video Visit from 11/24/2020 in [facility]  Total GAD-7 Score PHQ2-9    Flowsheet Row Video Visit from 08/31/2021 in [facility] Video Visit from 04/04/2021 in [facility] Video Visit from 01/19/2021 in [facility] Video Visit from 11/24/2020 in [facility]  PHQ-2 Total Score 1 0 0 2  PHQ-9 Total Score -- -- -- 5      Flowsheet Row Video Visit from 08/31/2021 in [facility] Video Visit from 04/04/2021 in [facility] Video Visit from 01/19/2021 in [facility]  C-SSRS RISK CATEGORY Low Risk Moderate Risk Moderate Risk        Assessment and Plan:   Alexandria Stokes is a 20 year old female with a past psychiatric history significant for generalized anxiety disorder, major depressive disorder, and sleep disturbances who presents to [facility] via virtual telephone visit for follow-up and medication management.  Patient reports no issues or concerns regarding her current medication regimen.  Patient denies the need for dosage adjustments at this time and is requesting refills on all her medications following the conclusion of the encounter.  Patient denies experiencing any depressive symptoms but endorses some anxiety related to recent loss in her family.  Patient to continue taking medications as prescribed.  Patient's medications to be e-prescribed to pharmacy of choice.  1. GAD (generalized anxiety disorder)  - sertraline (ZOLOFT) 50 MG tablet; Take 1 tablet (50 mg total) by mouth daily.  Dispense: 30 tablet; Refill: 2 - hydrOXYzine (ATARAX) 10 MG tablet; Take 1 tablet (10 mg total) by mouth at bedtime as needed for anxiety.  Dispense: 30 tablet; Refill: 2 -  busPIRone (BUSPAR) 5 MG tablet; Take 1 tablet (5 mg total) by mouth in the morning and at bedtime.  Dispense: 60 tablet; Refill: 2  2. Mild episode of recurrent major depressive disorder (HCC)  - sertraline (ZOLOFT) 50 MG tablet; Take 1 tablet (50 mg total) by mouth daily.  Dispense: 30 tablet; Refill: 2  3. Sleep disturbances  - hydrOXYzine (ATARAX) 10 MG tablet; Take 1 tablet (10 mg total) by mouth at bedtime as needed for anxiety.  Dispense: 30 tablet; Refill: 2  Patient to follow up in 2 months Provider spent a total of 10 minutes with the patient/reviewing patient's  chart  Meta Hatchet, PA 08/31/2021, 6:48 PM

## 2021-11-01 ENCOUNTER — Telehealth (HOSPITAL_COMMUNITY): Payer: No Typology Code available for payment source | Admitting: Physician Assistant

## 2021-11-01 ENCOUNTER — Encounter (HOSPITAL_COMMUNITY): Payer: Self-pay

## 2021-12-19 ENCOUNTER — Other Ambulatory Visit (HOSPITAL_COMMUNITY): Payer: Self-pay | Admitting: Physician Assistant

## 2021-12-19 DIAGNOSIS — F330 Major depressive disorder, recurrent, mild: Secondary | ICD-10-CM

## 2021-12-19 DIAGNOSIS — F411 Generalized anxiety disorder: Secondary | ICD-10-CM

## 2021-12-19 NOTE — Telephone Encounter (Signed)
Patient needs a follow up appointment for medication to be prescribed.

## 2021-12-22 ENCOUNTER — Encounter (HOSPITAL_COMMUNITY): Payer: Self-pay | Admitting: Physician Assistant

## 2021-12-22 ENCOUNTER — Ambulatory Visit (INDEPENDENT_AMBULATORY_CARE_PROVIDER_SITE_OTHER): Payer: No Typology Code available for payment source | Admitting: Physician Assistant

## 2021-12-22 DIAGNOSIS — F330 Major depressive disorder, recurrent, mild: Secondary | ICD-10-CM | POA: Diagnosis not present

## 2021-12-22 DIAGNOSIS — G479 Sleep disorder, unspecified: Secondary | ICD-10-CM

## 2021-12-22 DIAGNOSIS — F411 Generalized anxiety disorder: Secondary | ICD-10-CM | POA: Diagnosis not present

## 2021-12-22 MED ORDER — SERTRALINE HCL 50 MG PO TABS: 50.0000 mg | ORAL_TABLET | Freq: Every day | ORAL | 3 refills | Status: DC

## 2021-12-22 MED ORDER — BUSPIRONE HCL 5 MG PO TABS: 5.0000 mg | ORAL_TABLET | Freq: Two times a day (BID) | ORAL | 3 refills | Status: DC

## 2021-12-22 MED ORDER — HYDROXYZINE HCL 10 MG PO TABS: 10.0000 mg | ORAL_TABLET | Freq: Every evening | ORAL | 3 refills | Status: DC | PRN

## 2021-12-22 NOTE — Progress Notes (Addendum)
BH MD/PA/NP OP Progress Note ? ?12/22/2021 8:25 AM ?[name]  ?MRN:  [id number] ? ?Chief Complaint:  ?Chief Complaint  ?Patient presents with  ? Medication Management  ? ?HPI:  ? ?Alexandria Stokes is a 21 year old female with a past psychiatric history significant for generalized anxiety disorder, major depressive disorder, and sleep disturbances who presents to [facility] for follow-up and medication management.  Patient is currently being managed on the following medications: ? ?Sertraline (Zoloft) 50 mg daily ?Hydroxyzine 10 mg at bedtime ?Buspirone 5 mg 2 times daily ? ?Patient reports no issues or concerns regarding her current medication regimen.  Patient reports that she has been out of her medications since last Saturday and has been experiencing withdrawal symptoms related to being without sertraline.  Patient endorses the following symptoms: dizziness and mood swings.  Patient is requesting refills on her medications.  Patient denies depressive symptoms and states that her anxiety has been manageable.  Patient rates her anxiety at 2 out of 10.  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  A GAD-7 screen was performed with the patient scoring a 3. ? ?Patient is alert and oriented x4, pleasant, calm, cooperative, and fully engaged in conversation during the encounter.  Patient endorses good mood.  Patient denies suicidal or homicidal ideations.  She further denies auditory or visual hallucinations and does not appear to be responding to internal/external stimuli.  Patient endorses good sleep and receives on average 7 hours of sleep each night.  Patient endorses good appetite and eats on average 2-3 meals per day.  Patient denies alcohol consumption, tobacco use, and illicit drug use. ? ?Visit Diagnosis:  ?  ICD-10-CM   ?1. GAD (generalized anxiety disorder)  F41.1 sertraline (ZOLOFT) 50 MG tablet  ?  busPIRone  (BUSPAR) 5 MG tablet  ?  hydrOXYzine (ATARAX) 10 MG tablet  ?  ?2. Mild episode of recurrent major depressive disorder (HCC)  F33.0 sertraline (ZOLOFT) 50 MG tablet  ?  ?3. Sleep disturbances  G47.9 hydrOXYzine (ATARAX) 10 MG tablet  ?  ? ? ?Past Psychiatric History:  ?Generalized anxiety disorder ?Major depressive disorder ? ?Past Medical History:  ?Past Medical History:  ?Diagnosis Date  ? Anxiety   ? Depression   ? Suicide attempt [facility])   ? History reviewed. No pertinent surgical history. ? ?Family Psychiatric History:  ?Patient states that anxiety and depression exist on both sides of the family ? ?Family History: History reviewed. No pertinent family history. ? ?Social History:  ?Social History  ? ?Socioeconomic History  ? Marital status: Single  ?  Spouse name: Not on file  ? Number of children: Not on file  ? Years of education: Not on file  ? Highest education level: Not on file  ?Occupational History  ? Not on file  ?Tobacco Use  ? Smoking status: Never  ? Smokeless tobacco: Never  ?Substance and Sexual Activity  ? Alcohol use: No  ? Drug use: No  ? Sexual activity: Not on file  ?Other Topics Concern  ? Not on file  ?Social History Narrative  ? Not on file  ? ?Social Determinants of Health  ? ?Financial Resource Strain: Not on file  ?Food Insecurity: Not on file  ?Transportation Needs: Not on file  ?Physical Activity: Not on file  ?Stress: Not on file  ?Social Connections: Not on file  ? ? ?Allergies: No Known Allergies ? ?Metabolic Disorder Labs: ?Lab Results  ?  Component Value Date  ? HGBA1C 4.9 06/26/2020  ? MPG 94 06/26/2020  ? ?No results found for: PROLACTIN ?Lab Results  ?Component Value Date  ? CHOL 198 06/26/2020  ? TRIG 54 06/26/2020  ? HDL 75 06/26/2020  ? CHOLHDL 2.6 06/26/2020  ? VLDL 11 06/26/2020  ? LDLCALC 112 (H) 06/26/2020  ? ?Lab Results  ?Component Value Date  ? TSH 2.108 06/26/2020  ? ? ?Therapeutic Level Labs: ?No results found for: LITHIUM ?No results found for: VALPROATE ?No  components found for:  CBMZ ? ?Current Medications: ?Current Outpatient Medications  ?Medication Sig Dispense Refill  ? busPIRone (BUSPAR) 5 MG tablet Take 1 tablet (5 mg total) by mouth in the morning and at bedtime. 60 tablet 3  ? hydrOXYzine (ATARAX) 10 MG tablet Take 1 tablet (10 mg total) by mouth at bedtime as needed for anxiety. 30 tablet 3  ? naproxen (NAPROSYN) 250 MG tablet Take 250 mg by mouth once.    ? sertraline (ZOLOFT) 50 MG tablet Take 1 tablet (50 mg total) by mouth daily. 30 tablet 3  ? SPRINTEC 28 0.25-35 MG-MCG tablet Take 1 tablet by mouth daily.    ? ?No current facility-administered medications for this visit.  ? ? ? ?Musculoskeletal: ?Strength & Muscle Tone: within normal limits ?Gait & Station: normal ?Patient leans: N/A ? ?Psychiatric Specialty Exam: ?Review of Systems  ?Psychiatric/Behavioral:  Negative for decreased concentration, dysphoric mood, hallucinations, self-injury, sleep disturbance and suicidal ideas. The patient is not nervous/anxious and is not hyperactive.    ?Blood pressure (!) 134/92, pulse 85, height 5\' 2"  (1.575 m), weight 210 lb (95.3 kg), SpO2 99 %.Body mass index is 38.41 kg/m?.  ?General Appearance: Well Groomed  ?Eye Contact:  Good  ?Speech:  Clear and Coherent and Normal Rate  ?Volume:  Normal  ?Mood:  Euthymic  ?Affect:  Appropriate  ?Thought Process:  Coherent and Descriptions of Associations: Intact  ?Orientation:  Full (Time, Place, and Person)  ?Thought Content: WDL   ?Suicidal Thoughts:  No  ?Homicidal Thoughts:  No  ?Memory:  Immediate;   Good ?Recent;   Good ?Remote;   Good  ?Judgement:  Good  ?Insight:  Good  ?Psychomotor Activity:  Normal  ?Concentration:  Concentration: Good and Attention Span: Good  ?Recall:  Good  ?Fund of Knowledge: Good  ?Language: Good  ?Akathisia:  No  ?Handed:  Right  ?AIMS (if indicated): not done  ?Assets:  Communication Skills ?Desire for Improvement ?Housing ?Social Support ?Vocational/Educational  ?ADL's:  Intact   ?Cognition: WNL  ?Sleep:  Good  ? ?Screenings: ?AIMS   ? ?Flowsheet Row Admission (Discharged) from 06/25/2020 in [facility]  ?AIMS Total Score 0  ? ?  ? ?AUDIT   ? ?Flowsheet Row Admission (Discharged) from 06/25/2020 in [place] [facility]  ?Alcohol Use Disorder Identification Test Final Score (AUDIT) 0  ? ?  ? ?GAD-7   ? ?Flowsheet Row Clinical Support from 12/22/2021 in [facility] Video Visit from 08/31/2021 in [facility] Video Visit from 04/04/2021 in [facility] Video Visit from 01/19/2021 in [facility] Video Visit from 11/24/2020 in [facility]  ?Total GAD-7 Score 3 3 3 3 8   ? ?  ? ?PHQ2-9   ? ?Flowsheet Row Clinical Support from 12/22/2021 in [facility] Video Visit from 08/31/2021 in [facility] Video Visit from  04/04/2021 in [facility] Video Visit from 01/19/2021 in [facility] Video Visit from 11/24/2020 in [facility]  ?PHQ-2 Total Score 0 1 0 0 2  ?PHQ-9 Total Score -- -- -- -- 5  ? ?  ? ?Flowsheet Row Clinical Support from 12/22/2021 in [facility] Video Visit from 08/31/2021 in [facility] Video Visit from 04/04/2021 in [facility]  ?C-SSRS RISK CATEGORY No Risk Low Risk Moderate Risk  ? ?  ? ? ? ?Assessment and Plan:  ? ?Alexandria Stokes is a 21 year old female with a past psychiatric history significant for generalized anxiety disorder, major depressive disorder, and sleep disturbances who presents to [facility] for follow-up and medication management.  Patient presents today requesting refills on her medications and has been out of her  medications since last Saturday.  Patient reports experiencing withdrawal symptoms since being without sertraline.  Patient reports no issues or concerns regarding her current medication regimen.  Patient denies depressive

## 2022-01-10 ENCOUNTER — Telehealth (HOSPITAL_COMMUNITY): Payer: No Payment, Other | Admitting: Physician Assistant

## 2022-03-16 ENCOUNTER — Encounter (HOSPITAL_COMMUNITY): Payer: Self-pay

## 2022-03-21 ENCOUNTER — Telehealth (HOSPITAL_COMMUNITY): Payer: No Payment, Other | Admitting: Physician Assistant

## 2022-04-13 ENCOUNTER — Other Ambulatory Visit (HOSPITAL_COMMUNITY): Payer: Self-pay | Admitting: Physician Assistant

## 2022-04-13 DIAGNOSIS — F411 Generalized anxiety disorder: Secondary | ICD-10-CM

## 2022-04-13 DIAGNOSIS — F330 Major depressive disorder, recurrent, mild: Secondary | ICD-10-CM

## 2022-04-19 NOTE — Telephone Encounter (Signed)
Requesting patient be scheduled a follow up appointment prior to prescribing medication.

## 2022-04-21 ENCOUNTER — Other Ambulatory Visit (HOSPITAL_COMMUNITY): Payer: Self-pay | Admitting: Physician Assistant

## 2022-04-21 ENCOUNTER — Telehealth (HOSPITAL_COMMUNITY): Payer: Self-pay | Admitting: Physician Assistant

## 2022-04-21 DIAGNOSIS — G479 Sleep disorder, unspecified: Secondary | ICD-10-CM

## 2022-04-21 DIAGNOSIS — F330 Major depressive disorder, recurrent, mild: Secondary | ICD-10-CM

## 2022-04-21 DIAGNOSIS — F411 Generalized anxiety disorder: Secondary | ICD-10-CM

## 2022-04-21 MED ORDER — SERTRALINE HCL 50 MG PO TABS: 50.0000 mg | ORAL_TABLET | Freq: Every day | ORAL | 3 refills | Status: DC

## 2022-04-21 MED ORDER — BUSPIRONE HCL 5 MG PO TABS: 5.0000 mg | ORAL_TABLET | Freq: Two times a day (BID) | ORAL | 3 refills | Status: DC

## 2022-04-21 MED ORDER — HYDROXYZINE HCL 10 MG PO TABS: 10.0000 mg | ORAL_TABLET | Freq: Every evening | ORAL | 3 refills | Status: AC | PRN

## 2022-04-21 NOTE — Telephone Encounter (Signed)
Provider was contacted by Burna Forts regarding patient's request for refills.  Patient's medications to be e-prescribed to pharmacy of choice.

## 2022-04-21 NOTE — Progress Notes (Signed)
Provider was contacted by Alexandria Stokes. Reola Calkins, RN regarding patient's request for refills.  Patient's medications to be e-prescribed to pharmacy of choice.

## 2022-05-09 ENCOUNTER — Telehealth (HOSPITAL_COMMUNITY): Payer: No Typology Code available for payment source | Admitting: Physician Assistant

## 2022-09-05 ENCOUNTER — Encounter (HOSPITAL_COMMUNITY): Payer: Self-pay | Admitting: Physician Assistant

## 2022-09-05 ENCOUNTER — Telehealth (INDEPENDENT_AMBULATORY_CARE_PROVIDER_SITE_OTHER): Payer: No Typology Code available for payment source | Admitting: Physician Assistant

## 2022-09-05 DIAGNOSIS — F411 Generalized anxiety disorder: Secondary | ICD-10-CM

## 2022-09-05 DIAGNOSIS — G479 Sleep disorder, unspecified: Secondary | ICD-10-CM

## 2022-09-05 DIAGNOSIS — F330 Major depressive disorder, recurrent, mild: Secondary | ICD-10-CM

## 2022-09-05 MED ORDER — SERTRALINE HCL 50 MG PO TABS: 50.0000 mg | ORAL_TABLET | Freq: Every day | ORAL | 2 refills | Status: DC

## 2022-09-05 NOTE — Progress Notes (Signed)
BH MD/PA/NP OP Progress Note  Virtual Visit via Video Note  I connected with Alexandria Stokes on 09/05/22 at  1:30 PM EST by a video enabled telemedicine application and verified that I am speaking with the correct person using two identifiers.  Location: Patient: Home Provider: Clinic   I discussed the limitations of evaluation and management by telemedicine and the availability of in person appointments. The patient expressed understanding and agreed to proceed.  Follow Up Instructions:   I discussed the assessment and treatment plan with the patient. The patient was provided an opportunity to ask questions and all were answered. The patient agreed with the plan and demonstrated an understanding of the instructions.   The patient was advised to call back or seek an in-person evaluation if the symptoms worsen or if the condition fails to improve as anticipated.  I provided 11 minutes of non-face-to-face time during this encounter.  Malachy Mood, PA    09/05/2022 4:09 PM JORDANNE ELSBURY  MRN:  [id number]  Chief Complaint:  Chief Complaint  Patient presents with   Medication Refill   Follow-up   HPI:   Alexandria Stokes. Kunda is a 22 year old female with a past psychiatric history significant for generalized anxiety disorder, major depressive disorder, and sleep disturbances who presented to [facility] via a virtual video visit for follow-up and medication management.  Patient is currently being managed on the following medications:  Sertraline (Zoloft) 50 mg daily Hydroxyzine 10 mg at bedtime Buspirone 5 mg 2 times daily  Patient reports that she is doing well and is currently attending school.  Patient reports that she has no issues with her current medication regimen but states that she recently ran out of her sertraline and is now experiencing withdrawal symptoms.  Patient is experiencing the following withdrawal symptoms: Nausea, upset  stomach, irritability, and increased anxiety.  Patient denies depressive symptoms at this time.  Patient endorses anxiety and rates her anxiety as 5 or 6 out of 10.  Patient denies any new stressors at this time.  A GAD-7 screen was performed with the patient scoring a 9.  Patient is alert and oriented x 4, pleasant, calm, cooperative, and fully engaged in conversation during the encounter.  Patient endorses okay/so-so mood.  Patient denies suicidal or homicidal ideations.  Patient further denied auditory or visual hallucinations and does not appear to be responding to internal/external stimuli.  Patient endorses good sleep and receives on average 6 to 8 hours of sleep each night.  Patient endorses good appetite and eats on average 2-3 meals per day.  Patient denies alcohol consumption, tobacco use, or illicit drug use.  Visit Diagnosis:    ICD-10-CM   1. Sleep disturbances  G47.9     2. GAD (generalized anxiety disorder)  F41.1 sertraline (ZOLOFT) 50 MG tablet    3. Mild episode of recurrent major depressive disorder (HCC)  F33.0 sertraline (ZOLOFT) 50 MG tablet      Past Psychiatric History:  Generalized anxiety disorder Major depressive disorder  Past Medical History:  Past Medical History:  Diagnosis Date   Anxiety    Depression    Suicide attempt ([place])    History reviewed. No pertinent surgical history.  Family Psychiatric History:  Patient states that anxiety and depression exist on both sides of her family  Family History: History reviewed. No pertinent family history.  Social History:  Social History   Socioeconomic History   Marital status: Single  Spouse name: Not on file   Number of children: Not on file   Years of education: Not on file   Highest education level: Not on file  Occupational History   Not on file  Tobacco Use   Smoking status: Never   Smokeless tobacco: Never  Substance and Sexual Activity   Alcohol use: No   Drug use: No   Sexual activity:  Not on file  Other Topics Concern   Not on file  Social History Narrative   Not on file   Social Determinants of Health   Financial Resource Strain: Not on file  Food Insecurity: Not on file  Transportation Needs: Not on file  Physical Activity: Not on file  Stress: Not on file  Social Connections: Not on file    Allergies: No Known Allergies  Metabolic Disorder Labs: Lab Results  Component Value Date   HGBA1C 4.9 06/26/2020   MPG 94 06/26/2020   No results found for: "PROLACTIN" Lab Results  Component Value Date   CHOL 198 06/26/2020   TRIG 54 06/26/2020   HDL 75 06/26/2020   CHOLHDL 2.6 06/26/2020   VLDL 11 06/26/2020   LDLCALC 112 (H) 06/26/2020   Lab Results  Component Value Date   TSH 2.108 06/26/2020    Therapeutic Level Labs: No results found for: "LITHIUM" No results found for: "VALPROATE" No results found for: "CBMZ"  Current Medications: Current Outpatient Medications  Medication Sig Dispense Refill   busPIRone (BUSPAR) 5 MG tablet Take 1 tablet (5 mg total) by mouth in the morning and at bedtime. 60 tablet 3   hydrOXYzine (ATARAX) 10 MG tablet Take 1 tablet (10 mg total) by mouth at bedtime as needed for anxiety. 30 tablet 3   naproxen (NAPROSYN) 250 MG tablet Take 250 mg by mouth once.     sertraline (ZOLOFT) 50 MG tablet Take 1 tablet (50 mg total) by mouth daily. 30 tablet 2   SPRINTEC 28 0.25-35 MG-MCG tablet Take 1 tablet by mouth daily.     No current facility-administered medications for this visit.     Musculoskeletal: Strength & Muscle Tone: within normal limits Gait & Station: normal Patient leans: N/A  Psychiatric Specialty Exam: Review of Systems  Psychiatric/Behavioral:  Negative for decreased concentration, dysphoric mood, hallucinations, self-injury, sleep disturbance and suicidal ideas. The patient is not nervous/anxious and is not hyperactive.     There were no vitals taken for this visit.There is no height or weight on  file to calculate BMI.  General Appearance: Well Groomed  Eye Contact:  Good  Speech:  Clear and Coherent and Normal Rate  Volume:  Normal  Mood:  Euthymic  Affect:  Appropriate  Thought Process:  Coherent, Goal Directed, and Descriptions of Associations: Intact  Orientation:  Full (Time, Place, and Person)  Thought Content: WDL   Suicidal Thoughts:  No  Homicidal Thoughts:  No  Memory:  Immediate;   Good Recent;   Good Remote;   Good  Judgement:  Good  Insight:  Good  Psychomotor Activity:  Normal  Concentration:  Concentration: Good and Attention Span: Good  Recall:  Good  Fund of Knowledge: Good  Language: Good  Akathisia:  No  Handed:  Right  AIMS (if indicated): not done  Assets:  Communication Skills Desire for Improvement Housing Social Support Vocational/Educational  ADL's:  Intact  Cognition: WNL  Sleep:  Good   Screenings: AIMS    Flowsheet Row Admission (Discharged) from 06/25/2020 in [place]  [facility]  AIMS Total Score 0      AUDIT    Flowsheet [facility]  Alcohol Use Disorder Identification Test Final Score (AUDIT) 0      GAD-7    Flowsheet Row Video Visit from 09/05/2022 in [facility] Clinical Support from 12/22/2021 in [facility] Video Visit from 08/31/2021 in [facility] Video Visit from 04/04/2021 in [facility] Video Visit from 01/19/2021 in [facility]  Total GAD-7 Score 9 3 3 3 3       PHQ2-9    Flowsheet Row Video Visit from 09/05/2022 in [facility] Clinical Support from 12/22/2021 in [facility] Video Visit from 08/31/2021 in [facility] Video Visit from 04/04/2021 in [facility]  Video Visit from 01/19/2021 in [facility]  PHQ-2 Total Score 1 0 1 0 0      Flowsheet Row Video Visit from 09/05/2022 in [facility] Clinical Support from 12/22/2021 in [facility] Video Visit from 08/31/2021 in [facility]  C-SSRS RISK CATEGORY No Risk No Risk Low Risk        Assessment and Plan:   Silvia Hightower. Zobrist is a 22 year old female with a past psychiatric history significant for generalized anxiety disorder, major depressive disorder, and sleep disturbances who presented to [facility] via a virtual video visit for follow-up and medication management.  Patient reports no issues or concerns regarding her current medication regimen.  She reports that she recently ran out of her sertraline and has been experiencing withdrawal symptoms.  Patient is requesting refills on her sertraline and denies the need for refills on her other medications.  Patient's medications to be e-prescribed to pharmacy of choice.  Collaboration of Care: Collaboration of Care: Medication Management AEB provider managing patient's psychiatric medications and Psychiatrist AEB patient being followed by a mental health provider  Patient/Guardian was advised Release of Information must be obtained prior to any record release in order to collaborate their care with an outside provider. Patient/Guardian was advised if they have not already done so to contact the registration department to sign all necessary forms in order for [facility] to release information regarding their care.   Consent: Patient/Guardian gives verbal consent for treatment and assignment of benefits for services provided during this visit. Patient/Guardian expressed understanding and agreed to proceed.   1. GAD (generalized anxiety disorder) Patient to continue taking buspirone 5 mg 2 times daily for the  management of her generalized anxiety disorder Patient to continue taking hydroxyzine 10 mg at bedtime for the management of her generalized anxiety disorder  - sertraline (ZOLOFT) 50 MG tablet; Take 1 tablet (50 mg total) by mouth daily.  Dispense: 30 tablet; Refill: 2  2. Mild episode of recurrent major depressive disorder (HCC)  - sertraline (ZOLOFT) 50 MG tablet; Take 1 tablet (50 mg total) by mouth daily.  Dispense: 30 tablet; Refill: 2  3. Sleep disturbances Patient to continue taking hydroxyzine 10 mg at bedtime as needed for sleep disturbances  Patient to follow up in 2 months Provider spent a total of 11 minutes with the patient/reviewing patient's chart   [place], PA 09/05/2022, 4:09 PM

## 2022-10-05 ENCOUNTER — Telehealth (HOSPITAL_COMMUNITY): Payer: Self-pay | Admitting: Physician Assistant

## 2022-10-06 ENCOUNTER — Other Ambulatory Visit (HOSPITAL_COMMUNITY): Payer: Self-pay | Admitting: Physician Assistant

## 2022-10-06 DIAGNOSIS — F411 Generalized anxiety disorder: Secondary | ICD-10-CM

## 2022-10-06 MED ORDER — BUSPIRONE HCL 5 MG PO TABS: 5.0000 mg | ORAL_TABLET | Freq: Two times a day (BID) | ORAL | 1 refills | Status: DC

## 2022-10-06 NOTE — Telephone Encounter (Signed)
Provider was contacted by [name] regarding request from patient for her Buspar to be refilled.  Patient's medication to be e-prescribed to pharmacy of choice. 

## 2022-10-06 NOTE — Progress Notes (Signed)
Provider was contacted by Nicola Girt regarding request from patient for her Buspar to be refilled.  Patient's medication to be e-prescribed to pharmacy of choice.

## 2022-11-07 ENCOUNTER — Telehealth (HOSPITAL_COMMUNITY): Payer: No Typology Code available for payment source | Admitting: Physician Assistant

## 2022-11-07 ENCOUNTER — Encounter (HOSPITAL_COMMUNITY): Payer: Self-pay

## 2022-12-14 ENCOUNTER — Ambulatory Visit (INDEPENDENT_AMBULATORY_CARE_PROVIDER_SITE_OTHER): Payer: Medicaid Other | Admitting: Physician Assistant

## 2022-12-14 DIAGNOSIS — F330 Major depressive disorder, recurrent, mild: Secondary | ICD-10-CM | POA: Diagnosis not present

## 2022-12-14 DIAGNOSIS — F411 Generalized anxiety disorder: Secondary | ICD-10-CM

## 2022-12-14 MED ORDER — BUSPIRONE HCL 5 MG PO TABS: 5.0000 mg | ORAL_TABLET | Freq: Two times a day (BID) | ORAL | 2 refills | Status: DC

## 2022-12-14 MED ORDER — SERTRALINE HCL 50 MG PO TABS: 50.0000 mg | ORAL_TABLET | Freq: Every day | ORAL | 2 refills | Status: DC

## 2022-12-14 NOTE — Progress Notes (Unsigned)
BH MD/PA/NP OP Progress Note  12/15/2022 8:51 PM Alexandria Stokes  MRN:  [id number]  Chief Complaint:  Chief Complaint  Patient presents with   Follow-up   Medication Refill   HPI:   Alexandria Stokes is a 22 year old female with a past psychiatric history significant for generalized anxiety disorder, major depressive disorder, and sleep disturbances who [facility] via a virtual video visit for follow-up and medication management.  Patient is currently being managed on the following medications:  Sertraline (Zoloft) 50 mg daily Hydroxyzine 10 mg at bedtime Buspirone 5 mg 2 times daily  Patient presents to the encounter stating that she has been off her medications for 6 days after running out.  Since running out of her medications, patient states that she has been experiencing dizziness and irritability.  Patient continues to endorse the effectiveness of her medications.  Patient denies any depressive symptoms at this time but does endorse some anxiety she rates a 5 out of 10.  The only stressor that the patient endorses is current car troubles.  A GAD-7 screen was performed with the patient scoring a problem.  Patient is alert and oriented x 4, pleasant, calm, cooperative, and fully engaged in conversation during the encounter.  Patient endorses good mood.  Patient denies suicidal or homicidal ideations.  She further denies auditory or visual hallucinations and does not appear to be responding to internal/external stimuli.  Patient endorses fair sleep and receives on average 5 to 6 hours of sleep per night.  Patient endorses good appetite and eats on average 2-3 meals per day.  Patient endorses alcohol consumption.  Patient denies tobacco use and illicit drug use.  Visit Diagnosis:    ICD-10-CM   1. GAD (generalized anxiety disorder)  F41.1 sertraline (ZOLOFT) 50 MG tablet    busPIRone (BUSPAR) 5 MG tablet    2. Mild episode of  recurrent major depressive disorder  F33.0 sertraline (ZOLOFT) 50 MG tablet      Past Psychiatric History:  Generalized anxiety disorder Major depressive disorder  Past Medical History:  Past Medical History:  Diagnosis Date   Anxiety    Depression    Suicide attempt    History reviewed. No pertinent surgical history.  Family Psychiatric History:  Patient states that anxiety and depression exist on both sides of her family  Family History: History reviewed. No pertinent family history.  Social History:  Social History   Socioeconomic History   Marital status: Single    Spouse name: Not on file   Number of children: Not on file   Years of education: Not on file   Highest education level: Not on file  Occupational History   Not on file  Tobacco Use   Smoking status: Never   Smokeless tobacco: Never  Substance and Sexual Activity   Alcohol use: No   Drug use: No   Sexual activity: Not on file  Other Topics Concern   Not on file  Social History Narrative   Not on file   Social Determinants of Health   Financial Resource Strain: Not on file  Food Insecurity: Not on file  Transportation Needs: Not on file  Physical Activity: Not on file  Stress: Not on file  Social Connections: Not on file    Allergies: No Known Allergies  Metabolic Disorder Labs: Lab Results  Component Value Date   HGBA1C 4.9 06/26/2020   MPG 94 06/26/2020   No results found for: "PROLACTIN" Lab  Results  Component Value Date   CHOL 198 06/26/2020   TRIG 54 06/26/2020   HDL 75 06/26/2020   CHOLHDL 2.6 06/26/2020   VLDL 11 06/26/2020   LDLCALC 112 (H) 06/26/2020   Lab Results  Component Value Date   TSH 2.108 06/26/2020    Therapeutic Level Labs: No results found for: "LITHIUM" No results found for: "VALPROATE" No results found for: "CBMZ"  Current Medications: Current Outpatient Medications  Medication Sig Dispense Refill   busPIRone (BUSPAR) 5 MG tablet Take 1 tablet (5  mg total) by mouth in the morning and at bedtime. 60 tablet 2   hydrOXYzine (ATARAX) 10 MG tablet Take 1 tablet (10 mg total) by mouth at bedtime as needed for anxiety. 30 tablet 3   naproxen (NAPROSYN) 250 MG tablet Take 250 mg by mouth once.     sertraline (ZOLOFT) 50 MG tablet Take 1 tablet (50 mg total) by mouth daily. 30 tablet 2   SPRINTEC 28 0.25-35 MG-MCG tablet Take 1 tablet by mouth daily.     No current facility-administered medications for this visit.     Musculoskeletal: Strength & Muscle Tone: within normal limits Gait & Station: normal Patient leans: N/A  Psychiatric Specialty Exam: Review of Systems  Psychiatric/Behavioral:  Negative for decreased concentration, dysphoric mood, hallucinations, self-injury, sleep disturbance and suicidal ideas. The patient is not nervous/anxious and is not hyperactive.     Blood pressure 124/76, pulse 79, height 5\' 2"  (1.575 m), weight 208 lb (94.3 kg), SpO2 98 %.Body mass index is 38.04 kg/m.  General Appearance: Well Groomed  Eye Contact:  Good  Speech:  Clear and Coherent and Normal Rate  Volume:  Normal  Mood:  Euthymic  Affect:  Appropriate  Thought Process:  Coherent, Goal Directed, and Descriptions of Associations: Intact  Orientation:  Full (Time, Place, and Person)  Thought Content: WDL   Suicidal Thoughts:  No  Homicidal Thoughts:  No  Memory:  Immediate;   Good Recent;   Good Remote;   Good  Judgement:  Good  Insight:  Good  Psychomotor Activity:  Normal  Concentration:  Concentration: Good and Attention Span: Good  Recall:  Good  Fund of Knowledge: Good  Language: Good  Akathisia:  No  Handed:  Right  AIMS (if indicated): not done  Assets:  Communication Skills Desire for Improvement Housing Social Support Vocational/Educational  ADL's:  Intact  Cognition: WNL  Sleep:  Good   Screenings: AIMS    Flowsheet Row [facility]   AIMS Total Score 0      AUDIT    Flowsheet Row [facility]  Alcohol Use Disorder Identification Test Final Score (AUDIT) 0      GAD-7    Flowsheet Row Clinical Support from 12/14/2022 in [facility] Video Visit from 09/05/2022 in [facility] Clinical Support from 12/22/2021 in [facility] Video Visit from 08/31/2021 in [facility] Video Visit from 04/04/2021 in [facility]  Total GAD-7 Score 7 9 3 3 3       PHQ2-9    Flowsheet Row Clinical Support from 12/14/2022 in [facility] Video Visit from 09/05/2022 in [facility] Clinical Support from 12/22/2021 in [facility] Video Visit from 08/31/2021 in [facility] Video Visit from  04/04/2021 in [facility]  PHQ-2 Total Score 0 1 0 1 0      Flowsheet Row Clinical Support from 12/14/2022 in [facility] Video Visit from 09/05/2022 in [facility] Clinical Support from 12/22/2021 in [facility]  C-SSRS RISK CATEGORY No Risk No Risk No Risk        Assessment and Plan:   Alexandria Stokes is a 22 year old female with a past psychiatric history significant for generalized anxiety disorder, major depressive disorder, and sleep disturbances who presented to [facility] for follow-up and medication management.  Patient presents to the encounter for a refill request due to running out of her medications.  Patient reports that she has been experiencing irritability and some dizziness since running out of her medications.  Patient continues to endorse stability on her current  medication regimen.  Patient to continue taking medications as prescribed.  Patient's medications to be e-prescribed to pharmacy of choice.  Collaboration of Care: Collaboration of Care: Medication Management AEB provider managing patient's psychiatric medications and Psychiatrist AEB patient being followed by a mental health provider  Patient/Guardian was advised Release of Information must be obtained prior to any record release in order to collaborate their care with an outside provider. Patient/Guardian was advised if they have not already done so to contact the registration department to sign all necessary forms in order for [place] to release information regarding their care.   Consent: Patient/Guardian gives verbal consent for treatment and assignment of benefits for services provided during this visit. Patient/Guardian expressed understanding and agreed to proceed.   1. GAD (generalized anxiety disorder)  - sertraline (ZOLOFT) 50 MG tablet; Take 1 tablet (50 mg total) by mouth daily.  Dispense: 30 tablet; Refill: 2 - busPIRone (BUSPAR) 5 MG tablet; Take 1 tablet (5 mg total) by mouth in the morning and at bedtime.  Dispense: 60 tablet; Refill: 2  2. Mild episode of recurrent major depressive disorder  - sertraline (ZOLOFT) 50 MG tablet; Take 1 tablet (50 mg total) by mouth daily.  Dispense: 30 tablet; Refill: 2  Patient to follow up in 2 months Provider spent a total of 16 minutes with the patient/reviewing patient's chart  Meta Hatchet, PA 12/15/2022, 8:51 PM

## 2022-12-15 ENCOUNTER — Encounter (HOSPITAL_COMMUNITY): Payer: Self-pay | Admitting: Physician Assistant

## 2023-02-15 ENCOUNTER — Telehealth (INDEPENDENT_AMBULATORY_CARE_PROVIDER_SITE_OTHER): Payer: Medicaid Other | Admitting: Physician Assistant

## 2023-02-15 ENCOUNTER — Encounter (HOSPITAL_COMMUNITY): Payer: Self-pay | Admitting: Physician Assistant

## 2023-02-15 DIAGNOSIS — F330 Major depressive disorder, recurrent, mild: Secondary | ICD-10-CM

## 2023-02-15 DIAGNOSIS — F411 Generalized anxiety disorder: Secondary | ICD-10-CM | POA: Diagnosis not present

## 2023-02-15 MED ORDER — SERTRALINE HCL 50 MG PO TABS
50.0000 mg | ORAL_TABLET | Freq: Every day | ORAL | 3 refills | Status: DC
Start: 2023-02-15 — End: 2023-05-16

## 2023-02-15 MED ORDER — BUSPIRONE HCL 5 MG PO TABS
5.0000 mg | ORAL_TABLET | Freq: Two times a day (BID) | ORAL | 3 refills | Status: DC
Start: 2023-02-15 — End: 2023-05-16

## 2023-02-15 NOTE — Progress Notes (Signed)
[facility] MD/PA/NP OP Progress Note  Virtual Visit via Video Note  I connected with Alexandria Stokes on 02/15/23 at 11:00 AM EDT by a video enabled telemedicine application and verified that I am speaking with the correct person using two identifiers.  Location: Patient: Home Provider: Clinic   I discussed the limitations of evaluation and management by telemedicine and the availability of in person appointments. The patient expressed understanding and agreed to proceed.  Follow Up Instructions:   I discussed the assessment and treatment plan with the patient. The patient was provided an opportunity to ask questions and all were answered. The patient agreed with the plan and demonstrated an understanding of the instructions.   The patient was advised to call back or seek an in-person evaluation if the symptoms worsen or if the condition fails to improve as anticipated.  I provided 7 minutes of non-face-to-face time during this encounter.  Meta Hatchet, PA    02/15/2023 12:21 PM Alexandria Stokes  MRN:  [id number]  Chief Complaint:  Chief Complaint  Patient presents with   Follow-up   Medication Refill   HPI:   Alexandria Stokes is a 22 year old female with a past psychiatric history significant for generalized anxiety disorder, major depressive disorder, and sleep disturbances who presented to [facility] via a virtual video visit for follow-up and medication management.  Patient is currently being managed on the following medications:  Sertraline (Zoloft) 50 mg daily Hydroxyzine 10 mg at bedtime Buspirone 5 mg 2 times daily  Patient reports no issues or concerns regarding her current medication regimen.  Patient denies experiencing adverse side effects and denies the need for dosage adjustments at this time.  Patient denies depressive symptoms at this time but does endorse some anxiety related to recently acquiring her grandfather's car.   Patient denies any stressors at this time and appears to be stable on her current medication regimen.  A GAD-7 screen was performed with the patient scored a 3.  Patient is alert and oriented x 4, pleasant, calm, cooperative, and fully engaged in conversation during the encounter.  Patient endorses great mood.  Patient denies suicidal or homicidal ideations.  She further denies auditory or visual hallucinations and does not appear to be responding to internal/external stimuli.  Patient endorses good sleep and receives on average 6 to 8 hours of sleep each night.  Patient endorses good appetite and eats on average 2-3 meals per day.  Patient endorses alcohol consumption sparingly.  Patient denies tobacco use or illicit drug use.  Visit Diagnosis:    ICD-10-CM   1. GAD (generalized anxiety disorder)  F41.1 sertraline (ZOLOFT) 50 MG tablet    busPIRone (BUSPAR) 5 MG tablet    2. Mild episode of recurrent major depressive disorder (HCC)  F33.0 sertraline (ZOLOFT) 50 MG tablet      Past Psychiatric History:  Generalized anxiety disorder Major depressive disorder  Past Medical History:  Past Medical History:  Diagnosis Date   Anxiety    Depression    Suicide attempt (HCC)    History reviewed. No pertinent surgical history.  Family Psychiatric History:  Patient states that anxiety and depression exist on both sides of her family  Family History: History reviewed. No pertinent family history.  Social History:  Social History   Socioeconomic History   Marital status: Single    Spouse name: Not on file   Number of children: Not on file   Years of education: Not on  file   Highest education level: Not on file  Occupational History   Not on file  Tobacco Use   Smoking status: Never   Smokeless tobacco: Never  Substance and Sexual Activity   Alcohol use: No   Drug use: No   Sexual activity: Not on file  Other Topics Concern   Not on file  Social History Narrative   Not on file    Social Determinants of Health   Financial Resource Strain: Not on file  Food Insecurity: Not on file  Transportation Needs: Not on file  Physical Activity: Not on file  Stress: Not on file  Social Connections: Not on file    Allergies: No Known Allergies  Metabolic Disorder Labs: Lab Results  Component Value Date   HGBA1C 4.9 06/26/2020   MPG 94 06/26/2020   No results found for: "PROLACTIN" Lab Results  Component Value Date   CHOL 198 06/26/2020   TRIG 54 06/26/2020   HDL 75 06/26/2020   CHOLHDL 2.6 06/26/2020   VLDL 11 06/26/2020   LDLCALC 112 (H) 06/26/2020   Lab Results  Component Value Date   TSH 2.108 06/26/2020    Therapeutic Level Labs: No results found for: "LITHIUM" No results found for: "VALPROATE" No results found for: "CBMZ"  Current Medications: Current Outpatient Medications  Medication Sig Dispense Refill   busPIRone (BUSPAR) 5 MG tablet Take 1 tablet (5 mg total) by mouth in the morning and at bedtime. 60 tablet 3   hydrOXYzine (ATARAX) 10 MG tablet Take 1 tablet (10 mg total) by mouth at bedtime as needed for anxiety. 30 tablet 3   naproxen (NAPROSYN) 250 MG tablet Take 250 mg by mouth once.     sertraline (ZOLOFT) 50 MG tablet Take 1 tablet (50 mg total) by mouth daily. 30 tablet 3   SPRINTEC 28 0.25-35 MG-MCG tablet Take 1 tablet by mouth daily.     No current facility-administered medications for this visit.     Musculoskeletal: Strength & Muscle Tone: within normal limits Gait & Station: normal Patient leans: N/A  Psychiatric Specialty Exam: Review of Systems  Psychiatric/Behavioral:  Negative for decreased concentration, dysphoric mood, hallucinations, self-injury, sleep disturbance and suicidal ideas. The patient is not nervous/anxious and is not hyperactive.     There were no vitals taken for this visit.There is no height or weight on file to calculate BMI.  General Appearance: Well Groomed  Eye Contact:  Good  Speech:  Clear  and Coherent and Normal Rate  Volume:  Normal  Mood:  Euthymic  Affect:  Appropriate  Thought Process:  Coherent, Goal Directed, and Descriptions of Associations: Intact  Orientation:  Full (Time, Place, and Person)  Thought Content: WDL   Suicidal Thoughts:  No  Homicidal Thoughts:  No  Memory:  Immediate;   Good Recent;   Good Remote;   Good  Judgement:  Good  Insight:  Good  Psychomotor Activity:  Normal  Concentration:  Concentration: Good and Attention Span: Good  Recall:  Good  Fund of Knowledge: Good  Language: Good  Akathisia:  No  Handed:  Right  AIMS (if indicated): not done  Assets:  Communication Skills Desire for Improvement Housing Social Support Vocational/Educational  ADL's:  Intact  Cognition: WNL  Sleep:  Good   Screenings: AIMS    Flowsheet Row [facility]  AIMS Total Score 0      AUDIT    Flowsheet Row Ad[facility] (Dis[facility]  Alcohol Use Disorder Identification Test Final Score (AUDIT) 0      GAD-7    Flowsheet Row Video Visit from 02/15/2023 in [facility] Clinical Support from 12/14/2022 in [facility] Video Visit from 09/05/2022 in [facility] Clinical Support from 12/22/2021 in [facility] Video Visit from 08/31/2021 in [facility]  Total GAD-7 Score 3 7 9 3 3       PHQ2-9    Flowsheet Row Video Visit from 02/15/2023 in [facility] Clinical Support from 12/14/2022 in [facility] Video Visit from 09/05/2022 in [facility] Clinical Support from 12/22/2021 in [facility] Video Visit from 08/31/2021 in [facility]  PHQ-2 Total  Score 0 0 1 0 1      Flowsheet Row Video Visit from 02/15/2023 in [facility] Clinical Support from 12/14/2022 in [facility] Video Visit from 09/05/2022 in [facility]  C-SSRS RISK CATEGORY No Risk No Risk No Risk        Assessment and Plan:   Alexandria Stokes is a 22 year old female with a past psychiatric history significant for generalized anxiety disorder, major depressive disorder, and sleep disturbances who presented to [facility] for follow-up and medication management.  Patient reports no issues or concerns regarding her current medication regimen.  Patient denies the need for dosage adjustments at this time and appears stable on her current medication regimen.  Patient denies depression but does endorse minimal anxiety.  Patient to continue taking her medications as prescribed.  Patient's medications to be e-prescribed to pharmacy of choice.  Collaboration of Care: Collaboration of Care: Medication Management AEB provider managing patient's psychiatric medications and Psychiatrist AEB patient being followed by a mental health provider  Patient/Guardian was advised Release of Information must be obtained prior to any record release in order to collaborate their care with an outside provider. Patient/Guardian was advised if they have not already done so to contact the registration department to sign all necessary forms in order for [place] to release information regarding their care.   Consent: Patient/Guardian gives verbal consent for treatment and assignment of benefits for services provided during this visit. Patient/Guardian expressed understanding and agreed to proceed.   1. GAD (generalized anxiety disorder)  - sertraline (ZOLOFT) 50 MG tablet; Take 1 tablet (50 mg total) by mouth daily.  Dispense: 30 tablet; Refill: 3 - busPIRone (BUSPAR) 5 MG tablet; Take 1  tablet (5 mg total) by mouth in the morning and at bedtime.  Dispense: 60 tablet; Refill: 3  2. Mild episode of recurrent major depressive disorder (HCC)  - sertraline (ZOLOFT) 50 MG tablet; Take 1 tablet (50 mg total) by mouth daily.  Dispense: 30 tablet; Refill: 3  Patient to follow up in 2 months Provider spent a total of 7 minutes with the patient/reviewing patient's chart  Meta Hatchet, PA 02/15/2023, 12:21 PM

## 2023-05-16 ENCOUNTER — Encounter (HOSPITAL_COMMUNITY): Payer: Self-pay | Admitting: Physician Assistant

## 2023-05-16 ENCOUNTER — Telehealth (HOSPITAL_COMMUNITY): Payer: MEDICAID | Admitting: Physician Assistant

## 2023-05-16 DIAGNOSIS — F411 Generalized anxiety disorder: Secondary | ICD-10-CM | POA: Diagnosis not present

## 2023-05-16 DIAGNOSIS — F330 Major depressive disorder, recurrent, mild: Secondary | ICD-10-CM | POA: Diagnosis not present

## 2023-05-16 MED ORDER — BUSPIRONE HCL 5 MG PO TABS
5.0000 mg | ORAL_TABLET | Freq: Two times a day (BID) | ORAL | 3 refills | Status: DC
Start: 2023-05-16 — End: 2023-08-01

## 2023-05-16 MED ORDER — SERTRALINE HCL 50 MG PO TABS: 50.0000 mg | ORAL_TABLET | Freq: Every day | ORAL | 3 refills | Status: DC

## 2023-05-16 NOTE — Progress Notes (Signed)
[facility] MD/PA/NP OP Progress Note  Virtual Visit via Video Note  I connected with Alexandria Stokes on 05/16/23 at 10:00 AM EDT by a video enabled telemedicine application and verified that I am speaking with the correct person using two identifiers.  Location: Patient: Home Provider: Clinic   I discussed the limitations of evaluation and management by telemedicine and the availability of in person appointments. The patient expressed understanding and agreed to proceed.  Follow Up Instructions:   I discussed the assessment and treatment plan with the patient. The patient was provided an opportunity to ask questions and all were answered. The patient agreed with the plan and demonstrated an understanding of the instructions.   The patient was advised to call back or seek an in-person evaluation if the symptoms worsen or if the condition fails to improve as anticipated.  I provided 7 minutes of non-face-to-face time during this encounter.  Meta Hatchet, PA    05/16/2023 5:41 PM Alexandria Stokes  MRN:  [id number]  Chief Complaint:  Chief Complaint  Patient presents with   Follow-up   Medication Refill   HPI:   Alexandria Stokes is a 22 year old female with a past psychiatric history significant for generalized anxiety disorder, major depressive disorder, and sleep disturbances who presented to [facility] via a virtual video visit for follow-up and medication management.  Patient is currently being managed on the following medications:  Sertraline (Zoloft) 50 mg daily Buspirone 5 mg 2 times daily  Patient reports no issues or concerns regarding her current medication regimen.  Patient denies experiencing any adverse side effects and further denies the need for dosage adjustments at this time.  Patient reports that her mood has been good and denies depressive symptoms.  She endorses mild anxiety and endorses the following stressors: school and  recent move.  A GAD-7 screen was performed with the patient scoring a 4.  Patient is alert and oriented x 4, pleasant, calm, cooperative, and fully engaged in conversation during the encounter.  Patient endorses great mood.  Patient denies suicidal or homicidal ideations.  She further denies auditory or visual hallucinations and does not appear to be responding to internal/external stimuli.  Patient endorses good sleep and receives on average 5 to 7 hours of sleep per night.  Patient endorses good appetite and eats on average 2-3 meals per day.  Patient endorses alcohol consumption on occasion.  Patient denies tobacco use or illicit drug use.  Visit Diagnosis:    ICD-10-CM   1. GAD (generalized anxiety disorder)  F41.1 sertraline (ZOLOFT) 50 MG tablet    busPIRone (BUSPAR) 5 MG tablet    2. Mild episode of recurrent major depressive disorder (HCC)  F33.0 sertraline (ZOLOFT) 50 MG tablet      Past Psychiatric History:  Generalized anxiety disorder Major depressive disorder  Past Medical History:  Past Medical History:  Diagnosis Date   Anxiety    Depression    Suicide attempt (HCC)    History reviewed. No pertinent surgical history.  Family Psychiatric History:  Patient states that anxiety and depression exist on both sides of her family  Family History: History reviewed. No pertinent family history.  Social History:  Social History   Socioeconomic History   Marital status: Single    Spouse name: Not on file   Number of children: Not on file   Years of education: Not on file   Highest education level: Not on file  Occupational History  Not on file  Tobacco Use   Smoking status: Never   Smokeless tobacco: Never  Substance and Sexual Activity   Alcohol use: No   Drug use: No   Sexual activity: Not on file  Other Topics Concern   Not on file  Social History Narrative   Not on file   Social Determinants of Health   Financial Resource Strain: Not on file  Food  Insecurity: Not on file  Transportation Needs: Not on file  Physical Activity: Not on file  Stress: Not on file  Social Connections: Not on file    Allergies: No Known Allergies  Metabolic Disorder Labs: Lab Results  Component Value Date   HGBA1C 4.9 06/26/2020   MPG 94 06/26/2020   No results found for: "PROLACTIN" Lab Results  Component Value Date   CHOL 198 06/26/2020   TRIG 54 06/26/2020   HDL 75 06/26/2020   CHOLHDL 2.6 06/26/2020   VLDL 11 06/26/2020   LDLCALC 112 (H) 06/26/2020   Lab Results  Component Value Date   TSH 2.108 06/26/2020    Therapeutic Level Labs: No results found for: "LITHIUM" No results found for: "VALPROATE" No results found for: "CBMZ"  Current Medications: Current Outpatient Medications  Medication Sig Dispense Refill   busPIRone (BUSPAR) 5 MG tablet Take 1 tablet (5 mg total) by mouth in the morning and at bedtime. 60 tablet 3   hydrOXYzine (ATARAX) 10 MG tablet Take 1 tablet (10 mg total) by mouth at bedtime as needed for anxiety. 30 tablet 3   naproxen (NAPROSYN) 250 MG tablet Take 250 mg by mouth once.     sertraline (ZOLOFT) 50 MG tablet Take 1 tablet (50 mg total) by mouth daily. 30 tablet 3   SPRINTEC 28 0.25-35 MG-MCG tablet Take 1 tablet by mouth daily.     No current facility-administered medications for this visit.     Musculoskeletal: Strength & Muscle Tone: within normal limits Gait & Station: normal Patient leans: N/A  Psychiatric Specialty Exam: Review of Systems  Psychiatric/Behavioral:  Negative for decreased concentration, dysphoric mood, hallucinations, self-injury, sleep disturbance and suicidal ideas. The patient is not nervous/anxious and is not hyperactive.     There were no vitals taken for this visit.There is no height or weight on file to calculate BMI.  General Appearance: Well Groomed  Eye Contact:  Good  Speech:  Clear and Coherent and Normal Rate  Volume:  Normal  Mood:  Euthymic  Affect:   Appropriate  Thought Process:  Coherent, Goal Directed, and Descriptions of Associations: Intact  Orientation:  Full (Time, Place, and Person)  Thought Content: WDL   Suicidal Thoughts:  No  Homicidal Thoughts:  No  Memory:  Immediate;   Good Recent;   Good Remote;   Good  Judgement:  Good  Insight:  Good  Psychomotor Activity:  Normal  Concentration:  Concentration: Good and Attention Span: Good  Recall:  Good  Fund of Knowledge: Good  Language: Good  Akathisia:  No  Handed:  Right  AIMS (if indicated): not done  Assets:  Communication Skills Desire for Improvement Housing Social Support Vocational/Educational  ADL's:  Intact  Cognition: WNL  Sleep:  Good   Screenings: AIMS    Flowsheet Row [facility]  AIMS Total Score 0      AUDIT    Flowsheet Row Ad[facility]  Alcohol Use Disorder Identification  Test Final Score (AUDIT) 0      GAD-7    Flowsheet Row Video Visit from 05/16/2023 in [facility] Video Visit from 02/15/2023 in [facility] Clinical Support from 12/14/2022 in [facility] Video Visit from 09/05/2022 in [facility] Clinical Support from 12/22/2021 in [facility]  Total GAD-7 Score 4 3 7 9 3       PHQ2-9    Flowsheet Row Video Visit from 05/16/2023 in [facility] Video Visit from 02/15/2023 in [facility] Clinical Support from 12/14/2022 in [facility] Video Visit from 09/05/2022 in [facility] Clinical Support from 12/22/2021 in [facility]  PHQ-2 Total Score 0 0 0 1 0      Flowsheet Row Video Visit from 05/16/2023 in [facility] Video Visit from 02/15/2023 in [facility] Clinical Support from 12/14/2022 in [facility]  C-SSRS RISK CATEGORY No Risk No Risk No Risk        Assessment and Plan:   Alexandria Stokes is a 22 year old female with a past psychiatric history significant for generalized anxiety disorder, major depressive disorder, and sleep disturbances who presented to [facility] via virtual video visit for follow-up and medication management.  Patient continues to take her medications regularly and denies any issues or concerns.  Patient denies depressive symptoms but endorses some mild anxiety attributed to stressors in her life.  Patient denies the need for dosage adjustments at this time and would like to continue taking her medications as prescribed.  Patient's medications to be e-prescribed to pharmacy of choice.  Collaboration of Care: Collaboration of Care: Medication Management AEB provider managing patient's psychiatric medications and Psychiatrist AEB patient being followed by a mental health provider  Patient/Guardian was advised Release of Information must be obtained prior to any record release in order to collaborate their care with an outside provider. Patient/Guardian was advised if they have not already done so to contact the registration department to sign all necessary forms in order for [place] to release information regarding their care.   Consent: Patient/Guardian gives verbal consent for treatment and assignment of benefits for services provided during this visit. Patient/Guardian expressed understanding and agreed to proceed.   1. GAD (generalized anxiety disorder)  - sertraline (ZOLOFT) 50 MG tablet; Take 1 tablet (50 mg total) by mouth daily.  Dispense: 30 tablet; Refill: 3 - busPIRone (BUSPAR) 5 MG tablet; Take 1 tablet (5 mg total) by mouth in the morning and at  bedtime.  Dispense: 60 tablet; Refill: 3  2. Mild episode of recurrent major depressive disorder (HCC)  - sertraline (ZOLOFT) 50 MG tablet; Take 1 tablet (50 mg total) by mouth daily.  Dispense: 30 tablet; Refill: 3  Patient to follow up in 3 months Provider spent a total of 7 minutes with the patient/reviewing patient's chart  Meta Hatchet, PA 05/16/2023, 5:41 PM

## 2023-07-31 ENCOUNTER — Telehealth (HOSPITAL_COMMUNITY): Payer: MEDICAID | Admitting: Physician Assistant

## 2023-07-31 DIAGNOSIS — F411 Generalized anxiety disorder: Secondary | ICD-10-CM

## 2023-07-31 DIAGNOSIS — F330 Major depressive disorder, recurrent, mild: Secondary | ICD-10-CM

## 2023-08-01 ENCOUNTER — Encounter (HOSPITAL_COMMUNITY): Payer: Self-pay | Admitting: Physician Assistant

## 2023-08-01 MED ORDER — BUSPIRONE HCL 5 MG PO TABS
5.0000 mg | ORAL_TABLET | Freq: Two times a day (BID) | ORAL | 3 refills | Status: DC
Start: 2023-08-01 — End: 2023-10-21

## 2023-08-01 MED ORDER — SERTRALINE HCL 50 MG PO TABS
50.0000 mg | ORAL_TABLET | Freq: Every day | ORAL | 3 refills | Status: DC
Start: 2023-08-01 — End: 2023-10-21

## 2023-08-01 NOTE — Progress Notes (Addendum)
BH MD/PA/NP OP Progress Note  Virtual Visit via Video Note  I connected with Alexandria Stokes on 07/31/23 at  4:30 PM EST by a video enabled telemedicine application and verified that I am speaking with the correct person using two identifiers.  Location: Patient: Home Provider: Clinic   I discussed the limitations of evaluation and management by telemedicine and the availability of in person appointments. The patient expressed understanding and agreed to proceed.  Follow Up Instructions:   I discussed the assessment and treatment plan with the patient. The patient was provided an opportunity to ask questions and all were answered. The patient agreed with the plan and demonstrated an understanding of the instructions.   The patient was advised to call back or seek an in-person evaluation if the symptoms worsen or if the condition fails to improve as anticipated.  I provided 9 minutes of non-face-to-face time during this encounter.  Meta Hatchet, PA    07/31/2023 10:31 AM Alexandria Stokes  MRN:  [id number]  Chief Complaint:  Chief Complaint  Patient presents with   Follow-up   Medication Refill   HPI:   Alexandria Stokes is a 22 year old female with a past psychiatric history significant for generalized anxiety disorder, major depressive disorder, and sleep disturbances who presented to [facility] via a virtual video visit for follow-up and medication management.  Patient is currently being managed on the following medications:  Sertraline (Zoloft) 50 mg daily Buspirone 5 mg 2 times daily  Patient presents today encounter reporting no issues or concerns regarding her current medication regimen.  Patient denies the need for dosage adjustments at this time.  Patient denies overt depressive symptoms.  Patient endorses some anxiety she rates a 4 out of 10.  Patient attributes her anxiety to school.  Patient denied any other stressors at  this time.  A GAD-7 screen was performed with the patient scoring a 2.  Patient is alert and oriented x 4, pleasant, calm, cooperative, and fully engaged in conversation during the encounter.  Patient endorses great mood.  Patient denies suicidal or homicidal ideations.  She further denies auditory or visual hallucinations and does not appear to be responding to internal/external stimuli.  Patient endorses good sleep and receives on average 6 to 8 hours of sleep per night.  Patient endorses good appetite and eats on average 2-3 meals per day.  Patient endorses minimal alcohol consumption.  Patient denies tobacco use or illicit drug use.  Visit Diagnosis:    ICD-10-CM   1. GAD (generalized anxiety disorder)  F41.1 sertraline (ZOLOFT) 50 MG tablet    busPIRone (BUSPAR) 5 MG tablet    2. Mild episode of recurrent major depressive disorder (HCC)  F33.0 sertraline (ZOLOFT) 50 MG tablet      Past Psychiatric History:  Generalized anxiety disorder Major depressive disorder  Past Medical History:  Past Medical History:  Diagnosis Date   Anxiety    Depression    Suicide attempt (HCC)    History reviewed. No pertinent surgical history.  Family Psychiatric History:  Patient states that anxiety and depression exist on both sides of her family  Family History: History reviewed. No pertinent family history.  Social History:  Social History   Socioeconomic History   Marital status: Single    Spouse name: Not on file   Number of children: Not on file   Years of education: Not on file   Highest education level: Not on file  Occupational  History   Not on file  Tobacco Use   Smoking status: Never   Smokeless tobacco: Never  Substance and Sexual Activity   Alcohol use: No   Drug use: No   Sexual activity: Not on file  Other Topics Concern   Not on file  Social History Narrative   Not on file   Social Determinants of Health   Financial Resource Strain: Not on file  Food Insecurity:  Not on file  Transportation Needs: Not on file  Physical Activity: Not on file  Stress: Not on file  Social Connections: Not on file    Allergies: No Known Allergies  Metabolic Disorder Labs: Lab Results  Component Value Date   HGBA1C 4.9 06/26/2020   MPG 94 06/26/2020   No results found for: "PROLACTIN" Lab Results  Component Value Date   CHOL 198 06/26/2020   TRIG 54 06/26/2020   HDL 75 06/26/2020   CHOLHDL 2.6 06/26/2020   VLDL 11 06/26/2020   LDLCALC 112 (H) 06/26/2020   Lab Results  Component Value Date   TSH 2.108 06/26/2020    Therapeutic Level Labs: No results found for: "LITHIUM" No results found for: "VALPROATE" No results found for: "CBMZ"  Current Medications: Current Outpatient Medications  Medication Sig Dispense Refill   busPIRone (BUSPAR) 5 MG tablet Take 1 tablet (5 mg total) by mouth in the morning and at bedtime. 60 tablet 3   hydrOXYzine (ATARAX) 10 MG tablet Take 1 tablet (10 mg total) by mouth at bedtime as needed for anxiety. 30 tablet 3   naproxen (NAPROSYN) 250 MG tablet Take 250 mg by mouth once.     sertraline (ZOLOFT) 50 MG tablet Take 1 tablet (50 mg total) by mouth daily. 30 tablet 3   SPRINTEC 28 0.25-35 MG-MCG tablet Take 1 tablet by mouth daily.     No current facility-administered medications for this visit.     Musculoskeletal: Strength & Muscle Tone: within normal limits Gait & Station: normal Patient leans: N/A  Psychiatric Specialty Exam: Review of Systems  Psychiatric/Behavioral:  Negative for decreased concentration, dysphoric mood, hallucinations, self-injury, sleep disturbance and suicidal ideas. The patient is not nervous/anxious and is not hyperactive.     There were no vitals taken for this visit.There is no height or weight on file to calculate BMI.  General Appearance: Well Groomed  Eye Contact:  Good  Speech:  Clear and Coherent and Normal Rate  Volume:  Normal  Mood:  Euthymic  Affect:  Appropriate   Thought Process:  Coherent, Goal Directed, and Descriptions of Associations: Intact  Orientation:  Full (Time, Place, and Person)  Thought Content: WDL   Suicidal Thoughts:  No  Homicidal Thoughts:  No  Memory:  Immediate;   Good Recent;   Good Remote;   Good  Judgement:  Good  Insight:  Good  Psychomotor Activity:  Normal  Concentration:  Concentration: Good and Attention Span: Good  Recall:  Good  Fund of Knowledge: Good  Language: Good  Akathisia:  No  Handed:  Right  AIMS (if indicated): not done  Assets:  Communication Skills Desire for Improvement Housing Social Support Vocational/Educational  ADL's:  Intact  Cognition: WNL  Sleep:  Good   Screenings: AIMS    Flowsheet Row [facility]  AIMS Total Score 0      AUDIT    Flowsheet Row [facility]  Alcohol  Use Disorder Identification Test Final Score (AUDIT) 0      GAD-7    Flowsheet Row Video Visit from 07/31/2023 in [facility] Video Visit from 05/16/2023 in [facility] Video Visit from 02/15/2023 in [facility] Clinical Support from 12/14/2022 in [facility] Video Visit from 09/05/2022 in [facility]  Total GAD-7 Score 2 4 3 7 9       PHQ2-9    Flowsheet Row Video Visit from 07/31/2023 in [facility] Video Visit from 05/16/2023 in [facility] Video Visit from 02/15/2023 in [facility] Clinical Support from 12/14/2022 in [facility] Video Visit from 09/05/2022 in [facility]  PHQ-2 Total Score 0 0 0 0 1      Flowsheet Row Video Visit from 07/31/2023 in [facility] Video Visit from 05/16/2023 in [facility] Video Visit from 02/15/2023 in [facility]  C-SSRS RISK CATEGORY No Risk No Risk No Risk        Assessment and Plan:   Jayann Vaquero. Aprahamian is a 22 year old female with a past psychiatric history significant for generalized anxiety disorder, major depressive disorder, and sleep disturbances who presented to [facility] via virtual video visit for follow-up and medication management.  Patient presents today encounter reporting no issues or concerns regarding her current medication regimen.  Patient denies the need for dosage adjustments at this time and would like to continue taking her medications as prescribed.  Patient denies overt depressive symptoms but does endorse some anxiety attributed to school.  Patient endorses stability on her current medication regimen.  Patient's medications to be e-prescribed to pharmacy of choice.  Collaboration of Care: Collaboration of Care: Medication Management AEB provider managing patient's psychiatric medications and Psychiatrist AEB patient being followed by a mental health provider  Patient/Guardian was advised Release of Information must be obtained prior to any record release in order to collaborate their care with an outside provider. Patient/Guardian was advised if they have not already done so to contact the registration department to sign all necessary forms in order for [place] to release information regarding their care.   Consent: Patient/Guardian gives verbal consent for treatment and assignment of benefits for services provided during this visit. Patient/Guardian expressed understanding and agreed to proceed.   1. GAD (generalized anxiety disorder)  - sertraline (ZOLOFT) 50 MG tablet; Take 1 tablet (50 mg total) by mouth daily.  Dispense: 30 tablet; Refill: 3 - busPIRone (BUSPAR) 5 MG tablet; Take 1  tablet (5 mg total) by mouth in the morning and at bedtime.  Dispense: 60 tablet; Refill: 3  2. Mild episode of recurrent major depressive disorder (HCC)  - sertraline (ZOLOFT) 50 MG tablet; Take 1 tablet (50 mg total) by mouth daily.  Dispense: 30 tablet; Refill: 3  Patient to follow up in 3 months Provider spent a total of 9 minutes with the patient/reviewing patient's chart  Meta Hatchet, PA 07/31/2023, 10:31 AM

## 2023-10-19 ENCOUNTER — Telehealth (INDEPENDENT_AMBULATORY_CARE_PROVIDER_SITE_OTHER): Payer: MEDICAID | Admitting: Physician Assistant

## 2023-10-19 DIAGNOSIS — F330 Major depressive disorder, recurrent, mild: Secondary | ICD-10-CM | POA: Diagnosis not present

## 2023-10-19 DIAGNOSIS — F411 Generalized anxiety disorder: Secondary | ICD-10-CM | POA: Diagnosis not present

## 2023-10-21 ENCOUNTER — Encounter (HOSPITAL_COMMUNITY): Payer: Self-pay | Admitting: Physician Assistant

## 2023-10-21 MED ORDER — SERTRALINE HCL 50 MG PO TABS: 50.0000 mg | ORAL_TABLET | Freq: Every day | ORAL | 3 refills | Status: DC

## 2023-10-21 MED ORDER — BUSPIRONE HCL 5 MG PO TABS: 5.0000 mg | ORAL_TABLET | Freq: Two times a day (BID) | ORAL | 3 refills | Status: DC

## 2023-10-21 NOTE — Progress Notes (Unsigned)
BH MD/PA/NP OP Progress Note  Virtual Visit via Video Note  I connected with Alexandria Stokes on 10/19/23 at 11:00 AM EST by a video enabled telemedicine application and verified that I am speaking with the correct person using two identifiers.  Location: Patient: Home Provider: Clinic   I discussed the limitations of evaluation and management by telemedicine and the availability of in person appointments. The patient expressed understanding and agreed to proceed.  Follow Up Instructions:   I discussed the assessment and treatment plan with the patient. The patient was provided an opportunity to ask questions and all were answered. The patient agreed with the plan and demonstrated an understanding of the instructions.   The patient was advised to call back or seek an in-person evaluation if the symptoms worsen or if the condition fails to improve as anticipated.  I provided 6 minutes of non-face-to-face time during this encounter.  Meta Hatchet, PA    10/19/2023 11:00 AM Alexandria Stokes  MRN:  [id number]  Chief Complaint:  Chief Complaint  Patient presents with   Follow-up   Medication Refill   HPI:   Alexandria Stokes is a 23 year old female with a past psychiatric history significant for generalized anxiety disorder, major depressive disorder, and sleep disturbances who presented to [facility] via a virtual video visit for follow-up and medication management.  Patient is currently being managed on the following medications:  Sertraline (Zoloft) 50 mg daily Buspirone 5 mg 2 times daily  Patient continues to take her medications as prescribed.  She reports no issues or concerns and denies experiencing any adverse side effects from her current medication regimen.  Patient denies overt depressive symptoms nor does she endorse anxiety.  Patient further denies any new stressors at this time.  A GAD-7 screen was performed with the patient  scoring a 2.  Patient is alert and oriented x 4, pleasant, calm, cooperative, and fully engaged in conversation during the encounter.  Patient endorses good mood.  Patient exhibits euthymic mood with appropriate affect.  Patient denies suicidal or homicidal ideations.  She further denies auditory or visual hallucinations and does not appear to be responding to internal/external stimuli.  Patient endorses good sleep and receives on average 6 to 8 hours of sleep per night.  Patient endorses good appetite and eats on average 2-3 meals per day.  Patient denies alcohol consumption, tobacco use, or illicit drug use.  Visit Diagnosis:    ICD-10-CM   1. GAD (generalized anxiety disorder)  F41.1 sertraline (ZOLOFT) 50 MG tablet    busPIRone (BUSPAR) 5 MG tablet    2. Mild episode of recurrent major depressive disorder (HCC)  F33.0 sertraline (ZOLOFT) 50 MG tablet      Past Psychiatric History:  Generalized anxiety disorder Major depressive disorder  Past Medical History:  Past Medical History:  Diagnosis Date   Anxiety    Depression    Suicide attempt (HCC)    History reviewed. No pertinent surgical history.  Family Psychiatric History:  Patient states that anxiety and depression exist on both sides of her family  Family History: History reviewed. No pertinent family history.  Social History:  Social History   Socioeconomic History   Marital status: Single    Spouse name: Not on file   Number of children: Not on file   Years of education: Not on file   Highest education level: Not on file  Occupational History   Not on file  Tobacco  Use   Smoking status: Never   Smokeless tobacco: Never  Substance and Sexual Activity   Alcohol use: No   Drug use: No   Sexual activity: Not on file  Other Topics Concern   Not on file  Social History Narrative   Not on file   Social Drivers of Health   Financial Resource Strain: Not on file  Food Insecurity: Not on file  Transportation  Needs: Not on file  Physical Activity: Not on file  Stress: Not on file  Social Connections: Not on file    Allergies: No Known Allergies  Metabolic Disorder Labs: Lab Results  Component Value Date   HGBA1C 4.9 06/26/2020   MPG 94 06/26/2020   No results found for: "PROLACTIN" Lab Results  Component Value Date   CHOL 198 06/26/2020   TRIG 54 06/26/2020   HDL 75 06/26/2020   CHOLHDL 2.6 06/26/2020   VLDL 11 06/26/2020   LDLCALC 112 (H) 06/26/2020   Lab Results  Component Value Date   TSH 2.108 06/26/2020    Therapeutic Level Labs: No results found for: "LITHIUM" No results found for: "VALPROATE" No results found for: "CBMZ"  Current Medications: Current Outpatient Medications  Medication Sig Dispense Refill   busPIRone (BUSPAR) 5 MG tablet Take 1 tablet (5 mg total) by mouth in the morning and at bedtime. 60 tablet 3   hydrOXYzine (ATARAX) 10 MG tablet Take 1 tablet (10 mg total) by mouth at bedtime as needed for anxiety. 30 tablet 3   naproxen (NAPROSYN) 250 MG tablet Take 250 mg by mouth once.     sertraline (ZOLOFT) 50 MG tablet Take 1 tablet (50 mg total) by mouth daily. 30 tablet 3   SPRINTEC 28 0.25-35 MG-MCG tablet Take 1 tablet by mouth daily.     No current facility-administered medications for this visit.     Musculoskeletal: Strength & Muscle Tone: within normal limits Gait & Station: normal Patient leans: N/A  Psychiatric Specialty Exam: Review of Systems  Psychiatric/Behavioral:  Negative for decreased concentration, dysphoric mood, hallucinations, self-injury, sleep disturbance and suicidal ideas. The patient is not nervous/anxious and is not hyperactive.     There were no vitals taken for this visit.There is no height or weight on file to calculate BMI.  General Appearance: Well Groomed  Eye Contact:  Good  Speech:  Clear and Coherent and Normal Rate  Volume:  Normal  Mood:  Euthymic  Affect:  Appropriate  Thought Process:  Coherent, Goal  Directed, and Descriptions of Associations: Intact  Orientation:  Full (Time, Place, and Person)  Thought Content: WDL   Suicidal Thoughts:  No  Homicidal Thoughts:  No  Memory:  Immediate;   Good Recent;   Good Remote;   Good  Judgement:  Good  Insight:  Good  Psychomotor Activity:  Normal  Concentration:  Concentration: Good and Attention Span: Good  Recall:  Good  Fund of Knowledge: Good  Language: Good  Akathisia:  No  Handed:  Right  AIMS (if indicated): not done  Assets:  Communication Skills Desire for Improvement Housing Social Support Vocational/Educational  ADL's:  Intact  Cognition: WNL  Sleep:  Good   Screenings: AIMS    Flowsheet Row [facility]  AIMS Total Score 0      AUDIT    Flowsheet Row Ad[facility]  Alcohol Use Disorder Identification Test Final Score (AUDIT) 0  GAD-7    Flowsheet Row Video Visit from 10/19/2023 in [facility] Video Visit from 07/31/2023 in [facility] Video Visit from 05/16/2023 in [facility] Video Visit from 02/15/2023 in [facility] Clinical Support from 12/14/2022 in [facility]  Total GAD-7 Score 2 2 4 3 7       PHQ2-9    Flowsheet Row Video Visit from 10/19/2023 in [facility] Video Visit from 07/31/2023 in [facility] Video Visit from 05/16/2023 in [facility] Video Visit from 02/15/2023 in [facility] Clinical Support from 12/14/2022 in [facility]  PHQ-2 Total Score 0 0 0 0 0      Flowsheet Row Video Visit from 10/19/2023 in [facility] Video Visit from  07/31/2023 in [facility] Video Visit from 05/16/2023 in [facility]  C-SSRS RISK CATEGORY No Risk No Risk No Risk        Assessment and Plan:   Samyra Limb. Cuttino is a 23 year old female with a past psychiatric history significant for generalized anxiety disorder, major depressive disorder, and sleep disturbances who presented to [facility] via virtual video visit for follow-up and medication management.  Patient presents to the encounter stating that she continues to take her medications as prescribed.  Patient reports no issues or concerns regarding her current medication regimen and denies the need for dosage adjustments at this time.  Patient denies overt depressive symptoms nor does she endorse anxiety.  Patient endorses stability on her current medication regimen and would like to continue taking her medications as prescribed.  Patient's medications to be e-prescribed to pharmacy of choice.  Collaboration of Care: Collaboration of Care: Medication Management AEB provider managing patient's psychiatric medications and Psychiatrist AEB patient being followed by a mental health provider  Patient/Guardian was advised Release of Information must be obtained prior to any record release in order to collaborate their care with an outside provider. Patient/Guardian was advised if they have not already done so to contact the registration department to sign all necessary forms in order for [place] to release information regarding their care.   Consent: Patient/Guardian gives verbal consent for treatment and assignment of benefits for services provided during this visit. Patient/Guardian expressed understanding and agreed to proceed.   1. GAD (generalized anxiety disorder)  - sertraline (ZOLOFT) 50 MG tablet; Take 1 tablet (50 mg total) by mouth daily.  Dispense: 30 tablet; Refill: 3 - busPIRone (BUSPAR) 5 MG  tablet; Take 1 tablet (5 mg total) by mouth in the morning and at bedtime.  Dispense: 60 tablet; Refill: 3  2. Mild episode of recurrent major depressive disorder (HCC)  - sertraline (ZOLOFT) 50 MG tablet; Take 1 tablet (50 mg total) by mouth daily.  Dispense: 30 tablet; Refill: 3  Patient to follow up in 3 months Provider spent a total of 6 minutes with the patient/reviewing patient's chart  Meta Hatchet, PA 10/19/2023, 11:00 AM

## 2023-10-26 ENCOUNTER — Telehealth (HOSPITAL_COMMUNITY): Payer: No Payment, Other | Admitting: Physician Assistant

## 2024-01-18 ENCOUNTER — Encounter (HOSPITAL_COMMUNITY): Payer: Self-pay

## 2024-01-18 ENCOUNTER — Telehealth (HOSPITAL_COMMUNITY): Payer: MEDICAID | Admitting: Physician Assistant

## 2024-09-09 ENCOUNTER — Encounter (HOSPITAL_COMMUNITY): Payer: Self-pay | Admitting: Physician Assistant

## 2024-09-09 ENCOUNTER — Encounter (HOSPITAL_COMMUNITY): Payer: MEDICAID | Admitting: Physician Assistant

## 2024-09-09 ENCOUNTER — Ambulatory Visit (HOSPITAL_COMMUNITY): Payer: MEDICAID | Admitting: Physician Assistant

## 2024-09-09 DIAGNOSIS — F411 Generalized anxiety disorder: Secondary | ICD-10-CM

## 2024-09-09 DIAGNOSIS — F330 Major depressive disorder, recurrent, mild: Secondary | ICD-10-CM | POA: Diagnosis not present

## 2024-09-09 MED ORDER — SERTRALINE HCL 50 MG PO TABS: 50.0000 mg | ORAL_TABLET | Freq: Every day | ORAL | 3 refills | Status: AC

## 2024-09-09 MED ORDER — BUSPIRONE HCL 5 MG PO TABS: 5.0000 mg | ORAL_TABLET | Freq: Two times a day (BID) | ORAL | 3 refills | Status: AC

## 2024-09-09 NOTE — Progress Notes (Cosign Needed)
 BH MD/PA/NP OP Progress Note  09/09/2024 8:45 PM Alexandria Stokes  MRN:  [id number]  Chief Complaint:  Chief Complaint  Patient presents with   Follow-up   Medication Refill   HPI:   Alexandria Stokes is a 24 year old female with a past psychiatric history significant for generalized anxiety disorder and major depressive disorder (mild episode, recurrent) who presents to [facility] for follow-up and medication management.  Patient was last seen by this provider on 10/19/2023.  During her last encounter, patient was being managed on the following psychiatric medications:  Sertraline  50 mg daily Buspirone  5 mg 2 times daily  Patient reports no issues or concerns regarding her current medication regimen and denies experiencing any adverse side effects at this time.  Patient denies overt depressive symptoms but states that she still experiences some anxiety attributed to graduating soon.  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  Patient denies any new stressors at this time.  A PHQ 2 screen was performed with the patient scoring a 1.  A GAD-7 screen was also performed with the patient scoring a 4.  Patient is alert and oriented x 4, calm, cooperative, and fully engaged in conversation during the encounter.  Patient endorses great mood.  Patient exhibits euthymic mood with appropriate affect.  Patient denies suicidal or homicidal ideations.  She further denies auditory or visual hallucinations and does not appear to be responding to internal/external stimuli.  Patient endorses good sleep and receives on average 6 to 8 hours of sleep per night.  Patient endorses good appetite and eats on average 2-3 meals per day.  Patient endorses alcohol consumption sparingly.  Patient denies tobacco use or illicit drug use.  Visit Diagnosis:    ICD-10-CM   1. GAD (generalized anxiety disorder)  F41.1 busPIRone  (BUSPAR ) 5 MG tablet    sertraline   (ZOLOFT ) 50 MG tablet    2. Mild episode of recurrent major depressive disorder  F33.0 sertraline  (ZOLOFT ) 50 MG tablet      Past Psychiatric History:  Generalized anxiety disorder Major depressive disorder  Past Medical History:  Past Medical History:  Diagnosis Date   Anxiety    Depression    Suicide attempt (HCC)    History reviewed. No pertinent surgical history.  Family Psychiatric History:  Patient states that anxiety and depression exist on both sides of her family  Family History: History reviewed. No pertinent family history.  Social History:  Social History   Socioeconomic History   Marital status: Single    Spouse name: Not on file   Number of children: Not on file   Years of education: Not on file   Highest education level: Not on file  Occupational History   Not on file  Tobacco Use   Smoking status: Never   Smokeless tobacco: Never  Substance and Sexual Activity   Alcohol use: No   Drug use: No   Sexual activity: Not on file  Other Topics Concern   Not on file  Social History Narrative   Not on file   Social Drivers of Health   Tobacco Use: Low Risk (09/09/2024)   Patient History    Smoking Tobacco Use: Never    Smokeless Tobacco Use: Never    Passive Exposure: Not on file  Financial Resource Strain: Not on file  Food Insecurity: Not on file  Transportation Needs: Not on file  Physical Activity: Not on file  Stress: Not on file  Social Connections: Not on file  Depression (PHQ2-9): Low Risk (09/09/2024)   Depression (PHQ2-9)    PHQ-2 Score: 1  Alcohol Screen: Not on file  Housing: Not on file  Utilities: Not on file  Health Literacy: Not on file    Allergies: Allergies[1]  Metabolic Disorder Labs: Lab Results  Component Value Date   HGBA1C 4.9 06/26/2020   MPG 94 06/26/2020   No results found for: PROLACTIN Lab Results  Component Value Date   CHOL 198 06/26/2020   TRIG 54 06/26/2020   HDL 75 06/26/2020   CHOLHDL 2.6  06/26/2020   VLDL 11 06/26/2020   LDLCALC 112 (H) 06/26/2020   Lab Results  Component Value Date   TSH 2.108 06/26/2020    Therapeutic Level Labs: No results found for: LITHIUM No results found for: VALPROATE No results found for: CBMZ  Current Medications: Current Outpatient Medications  Medication Sig Dispense Refill   busPIRone  (BUSPAR ) 5 MG tablet Take 1 tablet (5 mg total) by mouth in the morning and at bedtime. 60 tablet 3   hydrOXYzine  (ATARAX ) 10 MG tablet Take 1 tablet (10 mg total) by mouth at bedtime as needed for anxiety. 30 tablet 3   naproxen (NAPROSYN) 250 MG tablet Take 250 mg by mouth once.     sertraline  (ZOLOFT ) 50 MG tablet Take 1 tablet (50 mg total) by mouth daily. 30 tablet 3   SPRINTEC  28 0.25-35 MG-MCG tablet Take 1 tablet by mouth daily.     No current facility-administered medications for this visit.     Musculoskeletal: Strength & Muscle Tone: within normal limits Gait & Station: normal Patient leans: N/A  Psychiatric Specialty Exam: Review of Systems  Psychiatric/Behavioral:  Negative for decreased concentration, dysphoric mood, hallucinations, self-injury, sleep disturbance and suicidal ideas. The patient is not nervous/anxious and is not hyperactive.     Blood pressure 126/82, pulse 73, temperature 97.9 F (36.6 C), temperature source Oral, height 5' 2 (1.575 m), weight 211 lb 9.6 oz (96 kg), SpO2 100%.Body mass index is 38.7 kg/m.  General Appearance: Well Groomed  Eye Contact:  Good  Speech:  Clear and Coherent and Normal Rate  Volume:  Normal  Mood:  Euthymic  Affect:  Appropriate  Thought Process:  Coherent, Goal Directed, and Descriptions of Associations: Intact  Orientation:  Full (Time, Place, and Person)  Thought Content: WDL   Suicidal Thoughts:  No  Homicidal Thoughts:  No  Memory:  Immediate;   Good Recent;   Good Remote;   Good  Judgement:  Good  Insight:  Good  Psychomotor Activity:  Normal  Concentration:   Concentration: Good and Attention Span: Good  Recall:  Good  Fund of Knowledge: Good  Language: Good  Akathisia:  No  Handed:  Right  AIMS (if indicated): not done  Assets:  Communication Skills Desire for Improvement Housing Social Support Transportation Vocational/Educational  ADL's:  Intact  Cognition: WNL  Sleep:  Good   Screenings: AIMS    Flowsheet Row [facility]  AIMS Total Score 0   AUDIT    Flowsheet Row [facility]  Alcohol Use Disorder Identification Test Final Score (AUDIT) 0   GAD-7    Flowsheet Row Clinical Support from 09/09/2024 in [facility] Video Visit from 10/19/2023 in [facility] Video Visit from 07/31/2023 in [facility] Video Visit from  05/16/2023 in [facility] Video Visit from 02/15/2023 in [facility]  Total GAD-7 Score 4 2 2 4 3    PHQ2-9    Flowsheet Row Clinical Support from 09/09/2024 in [facility] Video Visit from 10/19/2023 in [facility] Video Visit from 07/31/2023 in [facility] Video Visit from 05/16/2023 in [facility] Video Visit from 02/15/2023 in [facility]  PHQ-2 Total Score 1 0 0 0 0   Flowsheet Row Clinical Support from 09/09/2024 in [facility] Video Visit from 10/19/2023 in [facility] Video Visit from 07/31/2023 in [facility]  C-SSRS RISK CATEGORY Moderate Risk No Risk No Risk     Assessment and Plan:   Alexandria Stokes is a 24 year old female with a past psychiatric history significant for generalized anxiety  disorder and major depressive disorder (mild episode, recurrent) who presents to [facility] for follow-up and medication management.  Patient was last seen by this provider on 10/19/2023.  During her last encounter, patient was being managed on the following psychiatric medications:  Sertraline  50 mg daily Buspirone  5 mg 2 times daily  Patient states that she has been taking her medications regularly and denies experiencing any adverse side effects at this time.  Patient denies overt depressive symptoms but does endorse some anxiety attributed to having to graduate soon.  A PHQ 2 screen was performed with the patient scoring a 1.  A GAD-7 screen was also performed with the patient scoring a 4.  Despite experiencing some anxiety, patient endorses stability on her current medication regimen and would like to continue taking her medications as prescribed.  Patient's medications to be e-prescribed to pharmacy of choice.  A [place] Suicide Severity Rating Scale was performed with the patient being considered moderate risk.  Patient denies suicidal ideations and is able to contract for safety at this time.  Safety planning was discussed with the patient prior to the conclusion of the encounter.  - Patient was instructed to contact 911 in the event of a mental health crisis. - Patient was instructed to contact [facility] in the event of a mental health crisis. - Patient was instructed to present to [facility] in the event of a mental health crisis.  Collaboration of Care: Collaboration of Care: Medication Management AEB provider managing patient's psychiatric medication and Psychiatrist AEB patient being followed by a mental health provider at this facility.  Patient/Guardian was advised Release of Information must be obtained prior to any record release in order to collaborate their care with an outside  provider. Patient/Guardian was advised if they have not already done so to contact the registration department to sign all necessary forms in order for us  to release information regarding their care.   Consent: Patient/Guardian gives verbal consent for treatment and assignment of benefits for services provided during this visit. Patient/Guardian expressed understanding and agreed to proceed.   1. GAD (generalized anxiety disorder)  - busPIRone  (BUSPAR ) 5 MG tablet; Take 1 tablet (5 mg total) by mouth in the morning and at bedtime.  Dispense: 60 tablet; Refill: 3 - sertraline  (ZOLOFT ) 50 MG tablet; Take 1 tablet (50 mg total) by mouth daily.  Dispense: 30 tablet; Refill: 3  2. Mild episode of recurrent major depressive disorder  - sertraline  (ZOLOFT ) 50 MG  tablet; Take 1 tablet (50 mg total) by mouth daily.  Dispense: 30 tablet; Refill: 3  Patient to follow up in 3 months Provider spent a total of 20 minutes with the patient/reviewing the patient's chart  Reginia FORBES Bolster, PA 09/09/2024, 8:45 PM     [1] No Known Allergies

## 2024-12-09 ENCOUNTER — Encounter (HOSPITAL_COMMUNITY): Payer: MEDICAID | Admitting: Physician Assistant
